# Patient Record
Sex: Male | Born: 1965 | Race: White | Hispanic: No | Smoking: Never smoker
Health system: Southern US, Community
[De-identification: ages and names within clinical notes are randomized; demographics above are authoritative.]

## PROBLEM LIST (undated history)

## (undated) DIAGNOSIS — H9319 Tinnitus, unspecified ear: Secondary | ICD-10-CM

## (undated) DIAGNOSIS — K219 Gastro-esophageal reflux disease without esophagitis: Secondary | ICD-10-CM

## (undated) HISTORY — PX: VASECTOMY: SHX75

## (undated) HISTORY — DX: Tinnitus, unspecified ear: H93.19

## (undated) HISTORY — PX: NECK SURGERY: SHX720

## (undated) HISTORY — DX: Gastro-esophageal reflux disease without esophagitis: K21.9

## (undated) HISTORY — PX: SPINE SURGERY: SHX786

---

## 1997-11-24 ENCOUNTER — Other Ambulatory Visit: Admission: RE | Admit: 1997-11-24 | Discharge: 1997-11-24 | Payer: Self-pay | Admitting: Dermatology

## 2005-06-25 ENCOUNTER — Ambulatory Visit: Payer: Self-pay | Admitting: Orthopaedic Surgery

## 2005-10-08 ENCOUNTER — Ambulatory Visit: Payer: Self-pay | Admitting: Neurosurgery

## 2008-02-11 ENCOUNTER — Encounter: Admission: RE | Admit: 2008-02-11 | Discharge: 2008-02-11 | Payer: Self-pay | Admitting: Sports Medicine

## 2008-02-23 ENCOUNTER — Ambulatory Visit: Payer: Self-pay | Admitting: Family Medicine

## 2009-03-22 ENCOUNTER — Ambulatory Visit: Payer: Self-pay | Admitting: Family Medicine

## 2009-05-11 ENCOUNTER — Ambulatory Visit: Payer: Self-pay

## 2009-05-13 ENCOUNTER — Ambulatory Visit: Payer: Self-pay

## 2009-07-18 ENCOUNTER — Encounter: Payer: Self-pay | Admitting: Orthopedic Surgery

## 2009-08-05 ENCOUNTER — Encounter: Payer: Self-pay | Admitting: Orthopedic Surgery

## 2009-09-04 ENCOUNTER — Encounter: Payer: Self-pay | Admitting: Orthopedic Surgery

## 2010-04-04 ENCOUNTER — Ambulatory Visit (HOSPITAL_COMMUNITY)
Admission: RE | Admit: 2010-04-04 | Discharge: 2010-04-04 | Payer: Self-pay | Source: Home / Self Care | Admitting: Urology

## 2010-07-19 LAB — CBC
MCH: 33.2 pg (ref 26.0–34.0)
MCHC: 35.3 g/dL (ref 30.0–36.0)
MCV: 94.2 fL (ref 78.0–100.0)
Platelets: 141 10*3/uL — ABNORMAL LOW (ref 150–400)

## 2010-07-19 LAB — PROTIME-INR: Prothrombin Time: 13.7 seconds (ref 11.6–15.2)

## 2010-10-12 ENCOUNTER — Other Ambulatory Visit: Payer: Self-pay | Admitting: Urology

## 2010-10-12 DIAGNOSIS — N281 Cyst of kidney, acquired: Secondary | ICD-10-CM

## 2010-10-27 ENCOUNTER — Other Ambulatory Visit: Payer: Self-pay | Admitting: Urology

## 2010-10-27 DIAGNOSIS — N281 Cyst of kidney, acquired: Secondary | ICD-10-CM

## 2010-11-02 ENCOUNTER — Other Ambulatory Visit (HOSPITAL_COMMUNITY): Payer: Self-pay

## 2010-11-20 ENCOUNTER — Other Ambulatory Visit: Payer: Self-pay | Admitting: Urology

## 2010-11-20 ENCOUNTER — Ambulatory Visit (HOSPITAL_COMMUNITY)
Admission: RE | Admit: 2010-11-20 | Discharge: 2010-11-20 | Disposition: A | Discharge status: Home / Self Care | Payer: BC Managed Care – PPO | Source: Ambulatory Visit | Attending: Urology | Admitting: Urology

## 2010-11-20 ENCOUNTER — Other Ambulatory Visit: Payer: Self-pay | Admitting: Interventional Radiology

## 2010-11-20 DIAGNOSIS — Q619 Cystic kidney disease, unspecified: Secondary | ICD-10-CM | POA: Insufficient documentation

## 2010-11-20 DIAGNOSIS — N281 Cyst of kidney, acquired: Secondary | ICD-10-CM

## 2010-11-20 LAB — BASIC METABOLIC PANEL
BUN: 10 mg/dL (ref 6–23)
Creatinine, Ser: 0.72 mg/dL (ref 0.50–1.35)
GFR calc Af Amer: >60 mL/min (ref >60)
GFR calc non Af Amer: >60 mL/min (ref >60)
Potassium: 3.9 mEq/L (ref 3.5–5.1)

## 2010-11-20 LAB — PROTIME-INR
INR: 1.04 (ref 0.00–1.49)
Prothrombin Time: 13.8 seconds (ref 11.6–15.2)

## 2010-11-20 LAB — CBC
Hemoglobin: 14.4 g/dL (ref 13.0–17.0)
MCHC: 34.6 g/dL (ref 30.0–36.0)
WBC: 4.2 10*3/uL (ref 4.0–10.5)

## 2010-11-21 ENCOUNTER — Other Ambulatory Visit: Payer: Self-pay

## 2011-01-05 ENCOUNTER — Ambulatory Visit: Payer: Self-pay | Admitting: Family Medicine

## 2011-01-29 ENCOUNTER — Telehealth: Payer: Self-pay | Admitting: Emergency Medicine

## 2011-01-30 ENCOUNTER — Telehealth (HOSPITAL_COMMUNITY): Payer: Self-pay | Admitting: Interventional Radiology

## 2011-01-30 NOTE — Progress Notes (Signed)
Called benign cyto results to pt. He had no questions.

## 2011-01-30 NOTE — Telephone Encounter (Signed)
Message copied by Eda Keys III on Tue Jan 30, 2011 10:57 AM ------      Message from: [place], [place]      Created: Mon Jan 29, 2011  1:44 PM      Regarding: PT WANTS RESULTS       PT CALLED ON Monday 01-29-11 AND WOULD LIKE YOU TO CALL HIM WITH HIS RENAL CYSTS ASPIRATION RESULTS AT [phone]!            THANKS,      TAMMY

## 2011-02-19 NOTE — Telephone Encounter (Signed)
SEE TELEPHONE NOTE

## 2011-03-12 DIAGNOSIS — R1013 Epigastric pain: Secondary | ICD-10-CM | POA: Insufficient documentation

## 2011-03-12 DIAGNOSIS — K769 Liver disease, unspecified: Secondary | ICD-10-CM | POA: Insufficient documentation

## 2012-01-17 ENCOUNTER — Ambulatory Visit: Payer: Self-pay | Admitting: Family Medicine

## 2012-12-19 ENCOUNTER — Ambulatory Visit: Payer: Self-pay | Admitting: Gastroenterology

## 2012-12-22 LAB — PATHOLOGY REPORT

## 2013-05-15 DIAGNOSIS — S2329XA Dislocation of other parts of thorax, initial encounter: Secondary | ICD-10-CM | POA: Insufficient documentation

## 2013-08-27 DIAGNOSIS — K219 Gastro-esophageal reflux disease without esophagitis: Secondary | ICD-10-CM | POA: Insufficient documentation

## 2013-12-31 ENCOUNTER — Other Ambulatory Visit: Payer: Self-pay | Admitting: Neurosurgery

## 2013-12-31 DIAGNOSIS — M5412 Radiculopathy, cervical region: Secondary | ICD-10-CM

## 2014-01-01 ENCOUNTER — Other Ambulatory Visit: Payer: BC Managed Care – PPO

## 2014-01-05 ENCOUNTER — Other Ambulatory Visit: Payer: BC Managed Care – PPO

## 2014-04-27 ENCOUNTER — Ambulatory Visit: Payer: Self-pay | Admitting: Orthopaedic Surgery

## 2014-09-01 ENCOUNTER — Ambulatory Visit
Admit: 2014-09-01 | Disposition: A | Discharge status: Home / Self Care | Payer: Self-pay | Attending: Gastroenterology | Admitting: Gastroenterology

## 2014-09-01 ENCOUNTER — Other Ambulatory Visit: Payer: Self-pay

## 2014-09-28 LAB — SURGICAL PATHOLOGY

## 2014-12-06 HISTORY — PX: SHOULDER SURGERY: SHX246

## 2014-12-06 HISTORY — PX: BICEPS TENODESIS: SHX895

## 2014-12-28 ENCOUNTER — Other Ambulatory Visit: Payer: Self-pay | Admitting: Otolaryngology

## 2014-12-28 DIAGNOSIS — R1310 Dysphagia, unspecified: Secondary | ICD-10-CM

## 2015-01-18 ENCOUNTER — Ambulatory Visit: Payer: Self-pay

## 2015-03-28 ENCOUNTER — Ambulatory Visit (INDEPENDENT_AMBULATORY_CARE_PROVIDER_SITE_OTHER): Payer: BLUE CROSS/BLUE SHIELD | Admitting: Family Medicine

## 2015-03-28 ENCOUNTER — Encounter: Payer: Self-pay | Admitting: Family Medicine

## 2015-03-28 VITALS — BP 130/64 | HR 90 | Temp 98.5°F | Resp 16 | Ht 69.0 in | Wt 150.4 lb

## 2015-03-28 DIAGNOSIS — Z0000 Encounter for general adult medical examination without abnormal findings: Secondary | ICD-10-CM

## 2015-03-28 NOTE — Progress Notes (Signed)
Name: Craig ClossDavid A Lamb   MRN: [id number]    DOB: 1966-02-19   Date:03/28/2015       Progress Note  Subjective  Chief Complaint  Chief Complaint  Patient presents with  . Annual Exam    HPI  Patient presents for annual H&P at age 49. Since last visit he has had dislocation of the costovertebral joint and has been noted to have a hepatic lesion abdominal pain followed by another specialist.  Past Medical History  Diagnosis Date  . GERD (gastroesophageal reflux disease)     Social History  Substance Use Topics  . Smoking status: Never Smoker   . Smokeless tobacco: Never Used  . Alcohol Use: No     Current outpatient prescriptions:  .  omeprazole (PRILOSEC) 40 MG capsule, 40 mg once daily. , Disp: , Rfl:   No Known Allergies  Review of Systems  Constitutional: Negative for fever, chills and weight loss.  HENT: Negative for congestion, hearing loss, sore throat and tinnitus.   Eyes: Negative for blurred vision, double vision and redness.  Respiratory: Negative for cough, hemoptysis and shortness of breath.   Cardiovascular: Negative for chest pain, palpitations, orthopnea, claudication and leg swelling.  Gastrointestinal: Positive for abdominal pain. Negative for heartburn, nausea, vomiting, diarrhea, constipation and blood in stool.  Genitourinary: Negative for dysuria, urgency, frequency and hematuria.  Musculoskeletal: Positive for back pain and joint pain. Negative for myalgias, falls and neck pain.  Skin: Negative for itching.  Neurological: Negative for dizziness, tingling, tremors, focal weakness, seizures, loss of consciousness, weakness and headaches.  Endo/Heme/Allergies: Does not bruise/bleed easily.  Psychiatric/Behavioral: Negative for depression and substance abuse. The patient is not nervous/anxious and does not have insomnia.      Objective  Filed Vitals:   03/28/15 0936  BP: 130/64  Pulse: 90  Temp: 98.5 F (36.9 C)  TempSrc: Oral  Resp: 16  Height:  5\' 9"  (1.753 m)  Weight: 150 lb 6.4 oz (68.221 kg)  SpO2: 98%     Physical Exam  Constitutional: He is oriented to person, place, and time and well-developed, well-nourished, and in no distress.  HENT:  Head: Normocephalic.  Eyes: EOM are normal. Pupils are equal, round, and reactive to light.  Neck: Normal range of motion. Neck supple. No thyromegaly present.  Cardiovascular: Normal rate, regular rhythm and normal heart sounds.   No murmur heard. Pulmonary/Chest: Effort normal and breath sounds normal. No respiratory distress. He has no wheezes.  Abdominal: Soft. Bowel sounds are normal.  Genitourinary: Rectum normal, prostate normal and penis normal. Guaiac negative stool. No discharge found.  Musculoskeletal: Normal range of motion. He exhibits no edema.  Lymphadenopathy:    He has no cervical adenopathy.  Neurological: He is alert and oriented to person, place, and time. No cranial nerve deficit. Gait normal. Coordination normal.  Skin: Skin is warm and dry. No rash noted.  Psychiatric: Affect and judgment normal.      Assessment & Plan  1. Annual physical exam  - CBC with Differential/Platelet - POC Hemoccult Bld/Stl (1-Cd Office Dx) - PSA - Comprehensive metabolic panel - Lipid panel - TSH - POC Hemoccult Bld/Stl (1-Cd Office Dx)

## 2015-03-29 LAB — CBC WITH DIFFERENTIAL/PLATELET
BASOS ABS: 0 10*3/uL (ref 0.0–0.2)
Basos: 0 %
EOS (ABSOLUTE): 0.1 10*3/uL (ref 0.0–0.4)
Eos: 1 %
HEMOGLOBIN: 15.5 g/dL (ref 12.6–17.7)
Hematocrit: 44.9 % (ref 37.5–51.0)
IMMATURE GRANS (ABS): 0 10*3/uL (ref 0.0–0.1)
Immature Granulocytes: 0 %
LYMPHS: 36 %
Lymphocytes Absolute: 1.7 10*3/uL (ref 0.7–3.1)
MCH: 32.2 pg (ref 26.6–33.0)
MCHC: 34.5 g/dL (ref 31.5–35.7)
MCV: 93 fL (ref 79–97)
MONOCYTES: 11 %
Monocytes Absolute: 0.5 10*3/uL (ref 0.1–0.9)
Neutrophils Absolute: 2.5 10*3/uL (ref 1.4–7.0)
Neutrophils: 52 %
PLATELETS: 163 10*3/uL (ref 150–379)
RBC: 4.82 x10E6/uL (ref 4.14–5.80)
RDW: 12.6 % (ref 12.3–15.4)
WBC: 4.8 10*3/uL (ref 3.4–10.8)

## 2015-03-30 LAB — PSA: Prostate Specific Ag, Serum: 0.6 ng/mL (ref 0.0–4.0)

## 2015-03-30 LAB — COMPREHENSIVE METABOLIC PANEL
A/G RATIO: 2 (ref 1.1–2.5)
ALBUMIN: 4.4 g/dL (ref 3.5–5.5)
ALT: 26 IU/L (ref 0–44)
AST: 27 IU/L (ref 0–40)
Alkaline Phosphatase: 55 IU/L (ref 39–117)
BILIRUBIN TOTAL: 0.5 mg/dL (ref 0.0–1.2)
BUN / CREAT RATIO: 11 (ref 9–20)
BUN: 10 mg/dL (ref 6–24)
CHLORIDE: 100 mmol/L (ref 97–106)
CO2: 25 mmol/L (ref 18–29)
Calcium: 9.4 mg/dL (ref 8.7–10.2)
Creatinine, Ser: 0.89 mg/dL (ref 0.76–1.27)
GFR, EST AFRICAN AMERICAN: 114 mL/min/{1.73_m2} (ref >59)
GFR, EST NON AFRICAN AMERICAN: 99 mL/min/{1.73_m2} (ref >59)
GLUCOSE: 81 mg/dL (ref 65–99)
Globulin, Total: 2.2 g/dL (ref 1.5–4.5)
Potassium: 4.1 mmol/L (ref 3.5–5.2)
Sodium: 141 mmol/L (ref 136–144)
TOTAL PROTEIN: 6.6 g/dL (ref 6.0–8.5)

## 2015-03-30 LAB — LIPID PANEL
CHOL/HDL RATIO: 2.8 ratio (ref 0.0–5.0)
Cholesterol, Total: 188 mg/dL (ref 100–199)
HDL: 68 mg/dL (ref >39)
LDL Calculated: 110 mg/dL — ABNORMAL HIGH (ref 0–99)
Triglycerides: 48 mg/dL (ref 0–149)
VLDL CHOLESTEROL CAL: 10 mg/dL (ref 5–40)

## 2015-03-30 LAB — TSH: TSH: 1.22 u[IU]/mL (ref 0.450–4.500)

## 2015-04-11 ENCOUNTER — Encounter: Payer: Self-pay | Admitting: Family Medicine

## 2015-08-24 ENCOUNTER — Ambulatory Visit: Payer: BLUE CROSS/BLUE SHIELD | Admitting: Family Medicine

## 2015-10-31 ENCOUNTER — Other Ambulatory Visit: Payer: Self-pay | Admitting: Gastroenterology

## 2015-10-31 LAB — HM COLONOSCOPY

## 2016-03-28 ENCOUNTER — Emergency Department (HOSPITAL_COMMUNITY)
Admission: EM | Admit: 2016-03-28 | Discharge: 2016-03-28 | Disposition: A | Discharge status: Home / Self Care | Payer: BLUE CROSS/BLUE SHIELD | Attending: Emergency Medicine | Admitting: Emergency Medicine

## 2016-03-28 ENCOUNTER — Encounter (HOSPITAL_COMMUNITY): Payer: Self-pay | Admitting: *Deleted

## 2016-03-28 DIAGNOSIS — R202 Paresthesia of skin: Secondary | ICD-10-CM | POA: Diagnosis not present

## 2016-03-28 DIAGNOSIS — M5412 Radiculopathy, cervical region: Secondary | ICD-10-CM | POA: Diagnosis not present

## 2016-03-28 DIAGNOSIS — M542 Cervicalgia: Secondary | ICD-10-CM | POA: Diagnosis present

## 2016-03-28 MED ORDER — PREDNISONE 20 MG PO TABS
60.0000 mg | ORAL_TABLET | Freq: Once | ORAL | Status: AC
  Administered 2016-03-28: 60 mg via ORAL
  Filled 2016-03-28: qty 3

## 2016-03-28 MED ORDER — PREDNISONE 10 MG PO TABS: 20.0000 mg | ORAL_TABLET | Freq: Every day | ORAL | 0 refills | Status: AC

## 2016-03-28 MED ORDER — KETOROLAC TROMETHAMINE 30 MG/ML IJ SOLN
30.0000 mg | Freq: Once | INTRAMUSCULAR | Status: AC
  Administered 2016-03-28: 30 mg via INTRAMUSCULAR
  Filled 2016-03-28: qty 1

## 2016-03-28 NOTE — ED Triage Notes (Signed)
Pt reports hx of neck pain and cervical fusion. Pt normally has mild pain and bilateral little finger numbness but reports increase in pain and numbness since Saturday. Denies any trauma. Ambulatory at triage.

## 2016-03-28 NOTE — ED Provider Notes (Signed)
[facility]-EMERGENCY DEPT Provider Note   CSN: [id number] Arrival date & time: 03/28/16  1720     History   Chief Complaint Chief Complaint  Patient presents with  . Neck Pain  . Numbness    HPI Craig Bowen is a 50 y.o. male.  The history is provided by the patient.  Illness  This is a chronic problem. The current episode started 2 days ago. The problem occurs constantly. The problem has not changed since onset.Pertinent negatives include no chest pain, no abdominal pain, no headaches and no shortness of breath. Associated symptoms comments: Neck pain and hand tingling. Nothing aggravates the symptoms. Nothing relieves the symptoms. He has tried nothing for the symptoms. The treatment provided no relief.    Past Medical History:  Diagnosis Date  . GERD (gastroesophageal reflux disease)     Patient Active Problem List   Diagnosis Date Noted  . Acid reflux 08/27/2013  . 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 Abdominal pain, epigastric 03/12/2011  . Hepatic lesion 03/12/2011    Past Surgical History:  Procedure Laterality Date  . NECK SURGERY    . SHOULDER SURGERY Left august 2016       Home Medications    Prior to Admission medications   Medication Sig Start Date End Date Taking? Authorizing Provider  Multiple Vitamin (MULTIVITAMIN WITH MINERALS) TABS tablet Take 1 tablet by mouth daily.   Yes Historical Provider, MD  predniSONE (DELTASONE) 10 MG tablet Take 2 tablets (20 mg total) by mouth daily. 03/28/16 03/31/16  Alvira MondayErin Schlossman, MD    Family History History reviewed. No pertinent family history.  Social History Social History  Substance Use Topics  . Smoking status: Never Smoker  . Smokeless tobacco: Never Used  . Alcohol use No     Allergies   Patient has no known allergies.   Review of Systems Review of Systems  Constitutional: Negative for chills and fever.  HENT: Negative for ear pain and sore throat.   Eyes: Negative for pain and  visual disturbance.  Respiratory: Negative for cough and shortness of breath.   Cardiovascular: Negative for chest pain and palpitations.  Gastrointestinal: Negative for abdominal pain and vomiting.  Genitourinary: Negative for dysuria and hematuria.  Musculoskeletal: Positive for neck pain. Negative for arthralgias and back pain.  Skin: Negative for color change and rash.  Neurological: Negative for seizures, syncope and headaches.  All other systems reviewed and are negative.    Physical Exam Updated Vital Signs BP 108/74   Pulse 69   Temp 97.9 F (36.6 C) (Oral)   Resp 20   SpO2 99%   Physical Exam  Constitutional: He is oriented to person, place, and time. He appears well-developed and well-nourished.  HENT:  Head: Normocephalic and atraumatic.  Eyes: Conjunctivae are normal.  Neck: Neck supple.  Cardiovascular: Normal rate and regular rhythm.   No murmur heard. Pulmonary/Chest: Effort normal and breath sounds normal. No respiratory distress.  Abdominal: Soft. There is no tenderness.  Musculoskeletal: He exhibits no edema.  Neurological: He is alert and oriented to person, place, and time. No cranial nerve deficit or sensory deficit. He exhibits normal muscle tone. Coordination normal.  Reflex Scores:      Tricep reflexes are 1+ on the right side and 1+ on the left side.      Brachioradialis reflexes are 1+ on the right side and 1+ on the left side.      Patellar reflexes are 1+ on the right side and  1+ on the left side. Normal ambulation  Skin: Skin is warm and dry.  Psychiatric: He has a normal mood and affect.  Nursing note and vitals reviewed.    ED Treatments / Results  Labs (all labs ordered are listed, but only abnormal results are displayed) Labs Reviewed - No data to display  EKG  EKG Interpretation None       Radiology No results found.  Procedures Procedures (including critical care time)  Medications Ordered in ED Medications  ketorolac  (TORADOL) 30 MG/ML injection 30 mg (30 mg Intramuscular Given 03/28/16 2119)  predniSONE (DELTASONE) tablet 60 mg (60 mg Oral Given 03/28/16 2120)     Initial Impression / Assessment and Plan / ED Course  I have reviewed the triage vital signs and the nursing notes.  Pertinent labs & imaging results that were available during my care of the patient were reviewed by me and considered in my medical decision making (see chart for details).  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 He chronically has lower cervical neck pain with radiculopathy to bilateral hands, numbness and tingling in his pinkies. Over the last 3 days the pain has gotten worse and the tingling is red and warm his hand. He has no weakness no other symptoms. Vital signs are stable. He is afebrile. No history of steroid use, no history of IV drug abuse, no history of trauma, no history of no history of cancer. Patient does not have any signs of weakness on neurologic exam. He still does have sensation. Full range of motion of the neck with some minimal tenderness to palpation. Says the pain is similar to chronic he does not take any pain medications at home he is offered NSAIDs. He accepts. He's also given steroids told to follow-up with the back surgeon. He needs further evaluation on outpatient basis. Possible MRI outpatient. At this time no other further workup as needed pain is safe for discharge home. Vital signs stable time discharge. Strict return precautions are given.  Final Clinical Impressions(s) / ED Diagnoses   Final diagnoses:  Cervical radiculopathy  Paresthesia    New Prescriptions Discharge Medication List as of 03/28/2016 11:12 PM    START taking these medications   Details  predniSONE (DELTASONE) 10 MG tablet Take 2 tablets (20 mg total) by mouth daily., Starting Wed 03/28/2016, Until Sat  03/31/2016, Print         Cherlynn PerchesEric Rune Mendez, MD 03/29/16 [id number]    Alvira MondayErin Schlossman, MD 03/30/16 1442

## 2016-04-02 ENCOUNTER — Other Ambulatory Visit: Payer: Self-pay | Admitting: Family Medicine

## 2016-04-02 DIAGNOSIS — M5412 Radiculopathy, cervical region: Secondary | ICD-10-CM

## 2016-04-04 ENCOUNTER — Ambulatory Visit
Admission: RE | Admit: 2016-04-04 | Discharge: 2016-04-04 | Disposition: A | Discharge status: Home / Self Care | Payer: BLUE CROSS/BLUE SHIELD | Source: Ambulatory Visit | Attending: Family Medicine | Admitting: Family Medicine

## 2016-04-04 DIAGNOSIS — M4802 Spinal stenosis, cervical region: Secondary | ICD-10-CM | POA: Insufficient documentation

## 2016-04-04 DIAGNOSIS — Z9889 Other specified postprocedural states: Secondary | ICD-10-CM | POA: Diagnosis not present

## 2016-04-04 DIAGNOSIS — M5412 Radiculopathy, cervical region: Secondary | ICD-10-CM | POA: Insufficient documentation

## 2016-04-04 DIAGNOSIS — M5021 Other cervical disc displacement,  high cervical region: Secondary | ICD-10-CM | POA: Diagnosis not present

## 2016-07-04 LAB — HEMOGLOBIN A1C: Hemoglobin A1C: 4.9

## 2016-08-12 DIAGNOSIS — B303 Acute epidemic hemorrhagic conjunctivitis (enteroviral): Secondary | ICD-10-CM | POA: Diagnosis not present

## 2016-08-14 DIAGNOSIS — H43393 Other vitreous opacities, bilateral: Secondary | ICD-10-CM | POA: Diagnosis not present

## 2016-08-14 DIAGNOSIS — H2513 Age-related nuclear cataract, bilateral: Secondary | ICD-10-CM | POA: Diagnosis not present

## 2016-08-14 DIAGNOSIS — H1131 Conjunctival hemorrhage, right eye: Secondary | ICD-10-CM | POA: Diagnosis not present

## 2016-08-21 DIAGNOSIS — M7522 Bicipital tendinitis, left shoulder: Secondary | ICD-10-CM | POA: Diagnosis not present

## 2016-09-06 DIAGNOSIS — M7522 Bicipital tendinitis, left shoulder: Secondary | ICD-10-CM | POA: Diagnosis not present

## 2016-09-06 DIAGNOSIS — M62512 Muscle wasting and atrophy, not elsewhere classified, left shoulder: Secondary | ICD-10-CM | POA: Diagnosis not present

## 2016-09-06 DIAGNOSIS — K219 Gastro-esophageal reflux disease without esophagitis: Secondary | ICD-10-CM | POA: Diagnosis not present

## 2016-09-06 DIAGNOSIS — M25512 Pain in left shoulder: Secondary | ICD-10-CM | POA: Diagnosis not present

## 2016-09-06 DIAGNOSIS — T8484XA Pain due to internal orthopedic prosthetic devices, implants and grafts, initial encounter: Secondary | ICD-10-CM | POA: Diagnosis not present

## 2016-09-11 DIAGNOSIS — M7522 Bicipital tendinitis, left shoulder: Secondary | ICD-10-CM | POA: Diagnosis not present

## 2016-09-14 DIAGNOSIS — M7522 Bicipital tendinitis, left shoulder: Secondary | ICD-10-CM | POA: Diagnosis not present

## 2016-09-18 DIAGNOSIS — M7522 Bicipital tendinitis, left shoulder: Secondary | ICD-10-CM | POA: Diagnosis not present

## 2016-09-25 DIAGNOSIS — M7522 Bicipital tendinitis, left shoulder: Secondary | ICD-10-CM | POA: Diagnosis not present

## 2016-09-30 DIAGNOSIS — M25512 Pain in left shoulder: Secondary | ICD-10-CM | POA: Diagnosis not present

## 2016-09-30 DIAGNOSIS — M62512 Muscle wasting and atrophy, not elsewhere classified, left shoulder: Secondary | ICD-10-CM | POA: Diagnosis not present

## 2016-10-04 DIAGNOSIS — M7522 Bicipital tendinitis, left shoulder: Secondary | ICD-10-CM | POA: Diagnosis not present

## 2016-10-05 DIAGNOSIS — L738 Other specified follicular disorders: Secondary | ICD-10-CM | POA: Diagnosis not present

## 2016-10-07 DIAGNOSIS — M62512 Muscle wasting and atrophy, not elsewhere classified, left shoulder: Secondary | ICD-10-CM | POA: Diagnosis not present

## 2016-10-07 DIAGNOSIS — M25512 Pain in left shoulder: Secondary | ICD-10-CM | POA: Diagnosis not present

## 2016-10-12 DIAGNOSIS — M7522 Bicipital tendinitis, left shoulder: Secondary | ICD-10-CM | POA: Diagnosis not present

## 2016-10-16 DIAGNOSIS — M7522 Bicipital tendinitis, left shoulder: Secondary | ICD-10-CM | POA: Diagnosis not present

## 2016-10-18 DIAGNOSIS — M7522 Bicipital tendinitis, left shoulder: Secondary | ICD-10-CM | POA: Diagnosis not present

## 2016-10-23 DIAGNOSIS — M7522 Bicipital tendinitis, left shoulder: Secondary | ICD-10-CM | POA: Diagnosis not present

## 2016-10-25 DIAGNOSIS — M7522 Bicipital tendinitis, left shoulder: Secondary | ICD-10-CM | POA: Diagnosis not present

## 2016-10-29 DIAGNOSIS — M7522 Bicipital tendinitis, left shoulder: Secondary | ICD-10-CM | POA: Diagnosis not present

## 2016-10-31 DIAGNOSIS — M25512 Pain in left shoulder: Secondary | ICD-10-CM | POA: Diagnosis not present

## 2016-10-31 DIAGNOSIS — M62512 Muscle wasting and atrophy, not elsewhere classified, left shoulder: Secondary | ICD-10-CM | POA: Diagnosis not present

## 2016-11-01 DIAGNOSIS — M7522 Bicipital tendinitis, left shoulder: Secondary | ICD-10-CM | POA: Diagnosis not present

## 2016-11-05 DIAGNOSIS — M7522 Bicipital tendinitis, left shoulder: Secondary | ICD-10-CM | POA: Diagnosis not present

## 2016-11-06 DIAGNOSIS — M7522 Bicipital tendinitis, left shoulder: Secondary | ICD-10-CM | POA: Diagnosis not present

## 2016-11-08 DIAGNOSIS — M7522 Bicipital tendinitis, left shoulder: Secondary | ICD-10-CM | POA: Diagnosis not present

## 2016-11-15 DIAGNOSIS — M7522 Bicipital tendinitis, left shoulder: Secondary | ICD-10-CM | POA: Diagnosis not present

## 2016-11-20 DIAGNOSIS — M7522 Bicipital tendinitis, left shoulder: Secondary | ICD-10-CM | POA: Diagnosis not present

## 2016-11-27 DIAGNOSIS — M7522 Bicipital tendinitis, left shoulder: Secondary | ICD-10-CM | POA: Diagnosis not present

## 2016-11-30 DIAGNOSIS — M62512 Muscle wasting and atrophy, not elsewhere classified, left shoulder: Secondary | ICD-10-CM | POA: Diagnosis not present

## 2016-11-30 DIAGNOSIS — M25512 Pain in left shoulder: Secondary | ICD-10-CM | POA: Diagnosis not present

## 2016-12-04 DIAGNOSIS — M7522 Bicipital tendinitis, left shoulder: Secondary | ICD-10-CM | POA: Diagnosis not present

## 2016-12-12 DIAGNOSIS — S4432XA Injury of axillary nerve, left arm, initial encounter: Secondary | ICD-10-CM | POA: Diagnosis not present

## 2016-12-31 DIAGNOSIS — M62512 Muscle wasting and atrophy, not elsewhere classified, left shoulder: Secondary | ICD-10-CM | POA: Diagnosis not present

## 2016-12-31 DIAGNOSIS — M25512 Pain in left shoulder: Secondary | ICD-10-CM | POA: Diagnosis not present

## 2017-01-16 DIAGNOSIS — R29898 Other symptoms and signs involving the musculoskeletal system: Secondary | ICD-10-CM | POA: Diagnosis not present

## 2017-01-16 DIAGNOSIS — M542 Cervicalgia: Secondary | ICD-10-CM | POA: Diagnosis not present

## 2017-01-16 DIAGNOSIS — G8929 Other chronic pain: Secondary | ICD-10-CM | POA: Diagnosis not present

## 2017-01-16 DIAGNOSIS — S4432XA Injury of axillary nerve, left arm, initial encounter: Secondary | ICD-10-CM | POA: Diagnosis not present

## 2017-01-23 DIAGNOSIS — S4432XD Injury of axillary nerve, left arm, subsequent encounter: Secondary | ICD-10-CM | POA: Diagnosis not present

## 2017-02-07 DIAGNOSIS — S4432XD Injury of axillary nerve, left arm, subsequent encounter: Secondary | ICD-10-CM | POA: Diagnosis not present

## 2017-02-07 DIAGNOSIS — Z9889 Other specified postprocedural states: Secondary | ICD-10-CM | POA: Diagnosis not present

## 2017-02-07 DIAGNOSIS — R29898 Other symptoms and signs involving the musculoskeletal system: Secondary | ICD-10-CM | POA: Diagnosis not present

## 2017-02-07 DIAGNOSIS — Z981 Arthrodesis status: Secondary | ICD-10-CM | POA: Diagnosis not present

## 2017-03-04 DIAGNOSIS — M8568 Other cyst of bone, other site: Secondary | ICD-10-CM | POA: Diagnosis not present

## 2017-03-04 DIAGNOSIS — X58XXXA Exposure to other specified factors, initial encounter: Secondary | ICD-10-CM | POA: Diagnosis not present

## 2017-03-04 DIAGNOSIS — S46002A Unspecified injury of muscle(s) and tendon(s) of the rotator cuff of left shoulder, initial encounter: Secondary | ICD-10-CM | POA: Diagnosis not present

## 2017-03-04 DIAGNOSIS — S46812A Strain of other muscles, fascia and tendons at shoulder and upper arm level, left arm, initial encounter: Secondary | ICD-10-CM | POA: Diagnosis not present

## 2017-03-08 DIAGNOSIS — R29898 Other symptoms and signs involving the musculoskeletal system: Secondary | ICD-10-CM | POA: Diagnosis not present

## 2017-03-20 DIAGNOSIS — M25551 Pain in right hip: Secondary | ICD-10-CM | POA: Diagnosis not present

## 2017-04-19 DIAGNOSIS — R29898 Other symptoms and signs involving the musculoskeletal system: Secondary | ICD-10-CM | POA: Diagnosis not present

## 2017-04-19 DIAGNOSIS — M25512 Pain in left shoulder: Secondary | ICD-10-CM | POA: Diagnosis not present

## 2017-04-19 DIAGNOSIS — G8929 Other chronic pain: Secondary | ICD-10-CM | POA: Diagnosis not present

## 2017-04-25 DIAGNOSIS — G8929 Other chronic pain: Secondary | ICD-10-CM | POA: Diagnosis not present

## 2017-04-25 DIAGNOSIS — M25512 Pain in left shoulder: Secondary | ICD-10-CM | POA: Diagnosis not present

## 2017-04-25 DIAGNOSIS — R29898 Other symptoms and signs involving the musculoskeletal system: Secondary | ICD-10-CM | POA: Diagnosis not present

## 2017-05-22 DIAGNOSIS — M7522 Bicipital tendinitis, left shoulder: Secondary | ICD-10-CM | POA: Diagnosis not present

## 2017-05-22 DIAGNOSIS — S4432XD Injury of axillary nerve, left arm, subsequent encounter: Secondary | ICD-10-CM | POA: Diagnosis not present

## 2017-06-07 DIAGNOSIS — M25512 Pain in left shoulder: Secondary | ICD-10-CM | POA: Diagnosis not present

## 2017-06-11 DIAGNOSIS — R6889 Other general symptoms and signs: Secondary | ICD-10-CM | POA: Diagnosis not present

## 2017-06-11 DIAGNOSIS — J069 Acute upper respiratory infection, unspecified: Secondary | ICD-10-CM | POA: Diagnosis not present

## 2017-06-16 ENCOUNTER — Emergency Department
Admission: EM | Admit: 2017-06-16 | Discharge: 2017-06-16 | Disposition: A | Discharge status: Home / Self Care | Payer: BLUE CROSS/BLUE SHIELD | Attending: Emergency Medicine | Admitting: Emergency Medicine

## 2017-06-16 ENCOUNTER — Other Ambulatory Visit: Payer: Self-pay

## 2017-06-16 ENCOUNTER — Emergency Department: Payer: BLUE CROSS/BLUE SHIELD

## 2017-06-16 DIAGNOSIS — J181 Lobar pneumonia, unspecified organism: Secondary | ICD-10-CM | POA: Diagnosis not present

## 2017-06-16 DIAGNOSIS — J189 Pneumonia, unspecified organism: Secondary | ICD-10-CM

## 2017-06-16 DIAGNOSIS — R079 Chest pain, unspecified: Secondary | ICD-10-CM | POA: Diagnosis not present

## 2017-06-16 DIAGNOSIS — R05 Cough: Secondary | ICD-10-CM | POA: Diagnosis not present

## 2017-06-16 LAB — CBC
HEMATOCRIT: 43.8 % (ref 40.0–52.0)
Hemoglobin: 15.3 g/dL (ref 13.0–18.0)
MCH: 32.3 pg (ref 26.0–34.0)
MCHC: 34.8 g/dL (ref 32.0–36.0)
MCV: 92.6 fL (ref 80.0–100.0)
PLATELETS: 168 10*3/uL (ref 150–440)
RBC: 4.73 MIL/uL (ref 4.40–5.90)
RDW: 11.8 % (ref 11.5–14.5)
WBC: 8.8 10*3/uL (ref 3.8–10.6)

## 2017-06-16 LAB — BASIC METABOLIC PANEL
Anion gap: 11 (ref 5–15)
BUN: 16 mg/dL (ref 6–20)
CO2: 28 mmol/L (ref 22–32)
Calcium: 9.4 mg/dL (ref 8.9–10.3)
Chloride: 98 mmol/L — ABNORMAL LOW (ref 101–111)
Creatinine, Ser: 0.72 mg/dL (ref 0.61–1.24)
GFR calc Af Amer: >60 mL/min (ref >60)
GLUCOSE: 105 mg/dL — AB (ref 65–99)
POTASSIUM: 4.1 mmol/L (ref 3.5–5.1)
SODIUM: 137 mmol/L (ref 135–145)

## 2017-06-16 LAB — TROPONIN I: Troponin I: <0.03 ng/mL (ref <0.03)

## 2017-06-16 MED ORDER — HYDROCODONE-ACETAMINOPHEN 5-325 MG PO TABS: 1.0000 | ORAL_TABLET | Freq: Four times a day (QID) | ORAL | 0 refills | Status: DC | PRN

## 2017-06-16 MED ORDER — AZITHROMYCIN 500 MG PO TABS
ORAL_TABLET | ORAL | Status: AC
  Administered 2017-06-16: 500 mg via ORAL
  Filled 2017-06-16: qty 1

## 2017-06-16 MED ORDER — ACETAMINOPHEN 500 MG PO TABS
1000.0000 mg | ORAL_TABLET | Freq: Once | ORAL | Status: DC
  Filled 2017-06-16: qty 2

## 2017-06-16 MED ORDER — AZITHROMYCIN 500 MG PO TABS
500.0000 mg | ORAL_TABLET | Freq: Once | ORAL | Status: AC
  Administered 2017-06-16: 500 mg via ORAL

## 2017-06-16 MED ORDER — HYDROCODONE-ACETAMINOPHEN 5-325 MG PO TABS
1.0000 | ORAL_TABLET | Freq: Once | ORAL | Status: DC
Start: 2017-06-16 — End: 2017-06-16

## 2017-06-16 MED ORDER — AZITHROMYCIN 250 MG PO TABS: ORAL_TABLET | ORAL | 0 refills | Status: DC

## 2017-06-16 MED ORDER — HYDROCODONE-ACETAMINOPHEN 5-325 MG PO TABS
ORAL_TABLET | ORAL | Status: AC
  Filled 2017-06-16: qty 1

## 2017-06-16 NOTE — Discharge Instructions (Signed)

## 2017-06-16 NOTE — ED Provider Notes (Signed)
[facility] Emergency Department Provider Note  ____________________________________________  Time seen: Approximately 6:58 PM  I have reviewed the triage vital signs and the nursing notes.   HISTORY  Chief Complaint Chest Pain and Cough   HPI Craig Bowen is a 52 y.o. male with a history of GERD who presents for evaluation of chest pain and cough. Patient reports a week of cough productive of yellow phlegm initially and now dark green. Initially patient had a sore throat and bilateral ear aches. He went to see his primary care doctor and was diagnosed with a viral URI. This evening while watching TV patient developed sudden onset of sharp right-sided pleuritic chest pain which is mild if patient doesn't take a deep breath but becomes severe with deep breathing. Pain is non radiating. Patient denies fever, SOB, vomiting, diarrhea. Has no history of smoking or asthma. No history of heart disease. No personal or family history of blood clots, no recent travel or immobilization, leg pain or swelling, hemoptysis, exogenous hormones, or history of cancer.  Past Medical History:  Diagnosis Date  . GERD (gastroesophageal reflux disease)     Patient Active Problem List   Diagnosis Date Noted  . Acid reflux 08/27/2013  . 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 Abdominal pain, epigastric 03/12/2011  . Hepatic lesion 03/12/2011    Past Surgical History:  Procedure Laterality Date  . NECK SURGERY    . SHOULDER SURGERY Left august 2016    Prior to Admission medications   Medication Sig Start Date End Date Taking? Authorizing Provider  azithromycin (ZITHROMAX) 250 MG tablet Take 1 a day for 4 days 06/16/17   Don Perking, [place], MD  HYDROcodone-acetaminophen (NORCO/VICODIN) 5-325 MG tablet Take 1 tablet by mouth every 6 (six) hours as needed for moderate pain. 06/16/17   Nita Sickle, MD  Multiple Vitamin (MULTIVITAMIN WITH MINERALS) TABS tablet Take  1 tablet by mouth daily.    [provider]    Allergies Nsaids  No family history on file.  Social History Social History   Tobacco Use  . Smoking status: Never Smoker  . Smokeless tobacco: Never Used  Substance Use Topics  . Alcohol use: No    Alcohol/week: 0.0 oz  . Drug use: No    Review of Systems  Constitutional: Negative for fever. Eyes: Negative for visual changes. ENT: Negative for sore throat. Neck: No neck pain  Cardiovascular: + chest pain. Respiratory: Negative for shortness of breath. Gastrointestinal: Negative for abdominal pain, vomiting or diarrhea. Genitourinary: Negative for dysuria. Musculoskeletal: Negative for back pain. Skin: Negative for rash. Neurological: Negative for headaches, weakness or numbness. Psych: No SI or HI  ____________________________________________   PHYSICAL EXAM:  VITAL SIGNS: ED Triage Vitals  Enc Vitals Group     BP 06/16/17 1810 134/72     Pulse Rate 06/16/17 1810 83     Resp 06/16/17 1810 16     Temp 06/16/17 1810 99.3 F (37.4 C)     Temp Source 06/16/17 1810 Oral     SpO2 06/16/17 1810 100 %     Weight 06/16/17 1801 152 lb (68.9 kg)     Height 06/16/17 1801 5\' 8"  (1.727 m)     Head Circumference --      Peak Flow --      Pain Score 06/16/17 1801 10     Pain Loc --      Pain Edu? --      Excl. in GC? --  Constitutional: Alert and oriented. Well appearing and in no apparent distress. HEENT:      Head: Normocephalic and atraumatic.         Eyes: Conjunctivae are normal. Sclera is non-icteric.       Mouth/Throat: Mucous membranes are moist.       Neck: Supple with no signs of meningismus. Cardiovascular: Regular rate and rhythm. No murmurs, gallops, or rubs. 2+ symmetrical distal pulses are present in all extremities. No JVD. Respiratory: Normal respiratory effort. Lungs are clear to auscultation bilaterally. No wheezes, crackles, or rhonchi.  Gastrointestinal: Soft, non tender, and non  distended with positive bowel sounds. No rebound or guarding. Musculoskeletal: Nontender with normal range of motion in all extremities. No edema, cyanosis, or erythema of extremities. Neurologic: Normal speech and language. Face is symmetric. Moving all extremities. No gross focal neurologic deficits are appreciated. Skin: Skin is warm, dry and intact. No rash noted. Psychiatric: Mood and affect are normal. Speech and behavior are normal.  ____________________________________________   LABS (all labs ordered are listed, but only abnormal results are displayed)  Labs Reviewed  BASIC METABOLIC PANEL - Abnormal; Notable for the following components:      Result Value   Chloride 98 (*)    Glucose, Bld 105 (*)    All other components within normal limits  CBC  TROPONIN I   ____________________________________________  EKG  none  ____________________________________________  RADIOLOGY  Interpreted by me: CXR: R sided PNA   Interpretation by Radiologist:  Dg Chest 2 View  Result Date: 06/16/2017 CLINICAL DATA:  Productive cough.  Right-sided chest pain. EXAM: CHEST  2 VIEW COMPARISON:  None. FINDINGS: Heart size is normal. A right middle lobe pneumonia is present. The left lung is clear. There is no edema or effusion. Degenerative changes are present in the thoracic spine. Vertebral body heights and alignment are maintained. IMPRESSION: 1. Right middle lobe pneumonia. Electronically Signed   By: Marin Roberts M.D.   On: 06/16/2017 18:39     ____________________________________________   PROCEDURES  Procedure(s) performed: None Procedures Critical Care performed:  None ____________________________________________   INITIAL IMPRESSION / ASSESSMENT AND PLAN / ED COURSE   52 y.o. male with a history of GERD who presents for evaluation of productive cough times one week and new-onset of right sided pleuritic chest pain this evening. Patient is extremely well appearing,  no distress, low grade temp 99.69F but otherwise normal VS, lungs are clear to auscultation, normal work of breathing, normal sats. Chest x-ray concerning for right middle lobe pneumonia. Labs including white count is within normal limits. Patient meets criteria for outpatient management. He was started on a Z-Pak. His short course of Vicodin was prescribed for the pleuritic chest pain. Discussed return precautions and close follow-up with primary care doctor.      As part of my medical decision making, I reviewed the following data within the electronic MEDICAL RECORD NUMBER Nursing notes reviewed and incorporated, Labs reviewed , Radiograph reviewed , Notes from prior ED visits and [place] Controlled Substance Database    Pertinent labs & imaging results that were available during my care of the patient were reviewed by me and considered in my medical decision making (see chart for details).    ____________________________________________   FINAL CLINICAL IMPRESSION(S) / ED DIAGNOSES  Final diagnoses:  Community acquired pneumonia of right middle lobe of lung (HCC)      NEW MEDICATIONS STARTED DURING THIS VISIT:  ED Discharge Orders  Ordered    HYDROcodone-acetaminophen (NORCO/VICODIN) 5-325 MG tablet  Every 6 hours PRN     06/16/17 1858    azithromycin (ZITHROMAX) 250 MG tablet     06/16/17 1858       Note:  This document was prepared using Dragon voice recognition software and may include unintentional dictation errors.    Nita SickleVeronese, [place], MD 06/16/17 [phone]

## 2017-06-16 NOTE — ED Notes (Signed)
PT REFUSED EKG. Pt reported he felt as though this pain is upper resp. And ribs in nature and did not want an EKG performed.

## 2017-06-16 NOTE — ED Notes (Signed)
FIRST NURSE NOTE: pt c/o right lateral rib pain, worse with movement and deep breathing, states his PCP has been treating him for URI.the patient is in NAD on arrival.

## 2017-06-16 NOTE — ED Triage Notes (Signed)
Pt to ED reporting cough and URI infection for the past week with gradual onset of right sided rib pains that worsens when coughing. PT reports he was seen at PCP and given three prescriptions   Flonase , hydrocodone,

## 2017-06-18 ENCOUNTER — Telehealth: Payer: Self-pay | Admitting: Family Medicine

## 2017-06-18 NOTE — Telephone Encounter (Signed)
Pt states he was dxed with pneumonia in ED on 2/10.  States it is in middle lobe of right lung.  Pt has New Pt Appt  On 07/19/17.  Pt is requesting to be seen sooner due to the ED saying he needed f/u within 2 days.

## 2017-06-19 NOTE — Telephone Encounter (Signed)
Can move his NP appt to 8am on Mon Feb 18th.  Thanks  Erasmo DownerBacigalupo, Angela M, MD, MPH [facility] 06/19/2017 4:58 PM

## 2017-06-19 NOTE — Telephone Encounter (Signed)
Please advise 

## 2017-06-20 NOTE — Telephone Encounter (Signed)
Patient scheduled for 06/24/2017 at 8am

## 2017-06-20 NOTE — Telephone Encounter (Signed)
Lmtcb. Put hold on that appointment time.

## 2017-06-22 DIAGNOSIS — J181 Lobar pneumonia, unspecified organism: Secondary | ICD-10-CM | POA: Diagnosis not present

## 2017-06-24 ENCOUNTER — Ambulatory Visit (INDEPENDENT_AMBULATORY_CARE_PROVIDER_SITE_OTHER): Payer: BLUE CROSS/BLUE SHIELD | Admitting: Family Medicine

## 2017-06-24 ENCOUNTER — Encounter: Payer: Self-pay | Admitting: Family Medicine

## 2017-06-24 VITALS — BP 118/72 | HR 72 | Temp 97.9°F | Resp 16 | Ht 68.0 in | Wt 152.0 lb

## 2017-06-24 DIAGNOSIS — K219 Gastro-esophageal reflux disease without esophagitis: Secondary | ICD-10-CM | POA: Diagnosis not present

## 2017-06-24 DIAGNOSIS — J181 Lobar pneumonia, unspecified organism: Secondary | ICD-10-CM

## 2017-06-24 DIAGNOSIS — K769 Liver disease, unspecified: Secondary | ICD-10-CM

## 2017-06-24 DIAGNOSIS — J189 Pneumonia, unspecified organism: Secondary | ICD-10-CM | POA: Insufficient documentation

## 2017-06-24 NOTE — Progress Notes (Signed)
Patient: Craig Bowen, Male    DOB: 09-19-1965, 52 y.o.   MRN: [id number] Visit Date: 06/24/2017  Today's Provider: Shirlee Latch, MD   I, Joslyn Hy, CMA, am acting as scribe for Shirlee Latch, MD.  Chief Complaint  Patient presents with  . Establish Care  . Pneumonia   Subjective:    Establish Care Craig Bowen is a 52 y.o. male who presents today for health maintenance and to establish care. He feels fairly well. He reports [place] 6 [place] States he likes to swim. He reports he is normally sleeping well, but his cough keeps him awake..  -----------------------------------------------------------------  Follow up ER visit  Patient was seen in ER for chest pain/cough on 06/16/2017. He was treated for CAP. Treatment for this included Z Pak and Norco. He was not improving, so he went to [facility] on 06/22/2017, and was prescribed Levaquin, Tessalon Perles, Tussionex, Prednisone and Dulera. He reports fair compliance with treatment. He was advised to wait before taking the [facility], and he has not started the Prednisone. He reports this condition is Improved. He states he is about 50% improved.  Breathing well, denies SOB.  Able to sleep through the night last night.  On day 3/7 of Levaquin.  Continues to have R rib pain with deep inspiration, fatigue, and mild cough.  ------------------------------------------------------------------------------------  GERD: Taking Omeprazole 20mg  daily prn.  Only needs to take about 6 times per year.  Watching what he eats (avoids spicy foods) and avoids eating late at night  Ortho: Had L should surgery for bone spurs.  Never got better and then needed anchor revision in L shoulder 2 spinal surgeries for bone spurs (T11-S1, Cervical fusion) Swims 6 days for week which seems to help with joint pain. On medical leave for cervical spine issue from [employer] - want him to be entirely pain free  before flying again. Receiving PT for shoulder issue Was following with Dr Marin Shutter at [facility]  S/p vasectomy  H/o night sweats, leukopenia, pulmonary nodules, and hepatic lesion: was worked up by Oncology for night sweats.  Developed angioedema to NSAIDs.  Had mild leukopenia.  Was worked up for possible lymphoma, but no evidence was found.  These symptoms have all resolved. CT to look for lymphoma found incidental hepatic lesion and pulmonary nodules.  Reviewed records   Review of Systems  Constitutional: Negative.   HENT: Negative.   Eyes: Negative.   Respiratory: Negative.   Cardiovascular: Negative.   Gastrointestinal: Negative.   Endocrine: Negative.   Genitourinary: Negative.   Musculoskeletal: Negative.   Skin: Negative.   Allergic/Immunologic: Negative.   Neurological: Negative.   Hematological: Negative.   Psychiatric/Behavioral: Negative.     Social History      He  reports that  has never smoked. he has never used smokeless tobacco. He reports that he does not drink alcohol or use drugs.       Social History   Socioeconomic History  . Marital status: Divorced    Spouse name: None  . Number of children: 3  . Years of education: 16  . Highest education level: Bachelor's degree (e.g., BA, AB, BS)  Social Needs  . Financial resource strain: Not very hard  . Food insecurity - worry: Never true  . Food insecurity - inability: Never true  . Transportation needs - medical: No  . Transportation needs - non-medical: No  Occupational History  .  Occupation: PILOT    [employer]er: [employer]    Comment: currently on medical leave for cervical spine  Tobacco Use  . Smoking status: Never Smoker  . Smokeless tobacco: Never Used  Substance and Sexual Activity  . Alcohol use: No    Alcohol/week: 0.0 oz  . Drug use: No  . Sexual activity: Yes    Partners: Female    Birth control/protection: Surgical  Other Topics Concern  . None  Social History Narrative  . None     Past Medical History:  Diagnosis Date  . GERD (gastroesophageal reflux disease)   . Tinnitus    on 10% disability with [employer] (was in [employer]) for tinnitus     Patient Active Problem List   Diagnosis Date Noted  . CAP (community acquired pneumonia) 06/24/2017  . Acid reflux 08/27/2013  . Hepatic lesion 03/12/2011    Past Surgical History:  Procedure Laterality Date  . BICEPS TENODESIS Left 12/2014   and 09/2016  . NECK SURGERY    . SHOULDER SURGERY Left august 2016  . SPINE SURGERY    . VASECTOMY      Family History        Family Status  Relation Name Status  . Mother  Alive  . Father  Deceased       accidental death before pt was born  . Sister  Alive  . Brother  Alive  . PGF  (Not Specified)  . Sister Half Sister Alive  . MGM  (Not Specified)  . Neg Hx  (Not Specified)        His family history includes Healthy in his brother, mother, sister, and sister; Hypertension in his maternal grandmother; Leukemia in his paternal grandfather. There is no history of Colon cancer or Prostate cancer.      Allergies  Allergen Reactions  . Ketorolac Swelling    Face and lip swelling s/p injection  . Naproxen Swelling    Swelling of tongue, lips, jaw  . Nsaids Swelling    Face and tongue swelling with naprosyn and toradol     Current Outpatient Medications:  .  benzonatate (TESSALON) 200 MG capsule, Take by mouth., Disp: , Rfl:  .  chlorpheniramine-HYDROcodone (TUSSIONEX) 10-8 MG/5ML SUER, Take by mouth., Disp: , Rfl:  .  levofloxacin (LEVAQUIN) 500 MG tablet, Take by mouth., Disp: , Rfl:  .  Multiple Vitamin (MULTIVITAMIN WITH MINERALS) TABS tablet, Take 1 tablet by mouth daily., Disp: , Rfl:  .  omeprazole (PRILOSEC) 20 MG capsule, Take 20 mg by mouth daily as needed., Disp: , Rfl:    Patient Care Team: Erasmo DownerBacigalupo, Angela M, MD as PCP - General (Family Medicine)      Objective:   Vitals: BP 118/72 (BP Location: Left Arm, Patient Position: Sitting, Cuff  Size: Normal)   Pulse 72   Temp 97.9 F (36.6 C) (Oral)   Resp 16   Ht 5\' 8"  (1.727 m)   Wt 152 lb (68.9 kg)   SpO2 99%   BMI 23.11 kg/m    Vitals:   06/24/17 0816  BP: 118/72  Pulse: 72  Resp: 16  Temp: 97.9 F (36.6 C)  TempSrc: Oral  SpO2: 99%  Weight: 152 lb (68.9 kg)  Height: 5\' 8"  (1.727 m)     Physical Exam  Constitutional: He is oriented to person, place, and time. He appears well-developed and well-nourished. No distress.  HENT:  Head: Normocephalic and atraumatic.  Right Ear: External ear normal.  Left Ear:  External ear normal.  Nose: Nose normal.  Mouth/Throat: Oropharynx is clear and moist.  Eyes: Conjunctivae and EOM are normal. Pupils are equal, round, and reactive to light. No scleral icterus.  Neck: Neck supple. No thyromegaly present.  Cardiovascular: Normal rate, regular rhythm, normal heart sounds and intact distal pulses.  No murmur heard. Pulmonary/Chest: Effort normal. No respiratory distress. He has no wheezes. He has rales (in RML).  Abdominal: Soft. Bowel sounds are normal. He exhibits no distension. There is no tenderness. There is no rebound and no guarding.  Musculoskeletal: He exhibits no edema or deformity.  Lymphadenopathy:    He has no cervical adenopathy.  Neurological: He is alert and oriented to person, place, and time.  Skin: Skin is warm and dry. No rash noted.  Psychiatric: He has a normal mood and affect. His behavior is normal.  Vitals reviewed.    Depression Screen PHQ 2/9 Scores 06/24/2017 03/28/2015  PHQ - 2 Score 0 0     Assessment & Plan:      Problem List Items Addressed This Visit      Respiratory   CAP (community acquired pneumonia)    Seems to be improving greatly Discussed natural course of illness Okay to finish Levaquin as prescribed Does not seem to need Dulera or prednisone Return precautions discussed      Relevant Medications   benzonatate (TESSALON) 200 MG capsule    chlorpheniramine-HYDROcodone (TUSSIONEX) 10-8 MG/5ML SUER   levofloxacin (LEVAQUIN) 500 MG tablet     Digestive   Acid reflux - Primary    Well-controlled with rare symptoms Okay to continue taking PPI every 2 months as needed      Relevant Medications   omeprazole (PRILOSEC) 20 MG capsule     Other   Hepatic lesion    Has previously been worked up by oncology and was told that these incidental lesions are benign          Return in about 6 months (around 12/22/2017) for physical.  We will request records from last colonoscopy from [facility]  The entirety of the information documented in the History of Present Illness, Review of Systems and Physical Exam were personally obtained by me. Portions of this information were initially documented by Irving Burton Ratchford, CMA and reviewed by me for thoroughness and accuracy.    Erasmo Downer, MD, MPH [facility] 06/24/2017 5:01 PM

## 2017-06-24 NOTE — Assessment & Plan Note (Signed)
Seems to be improving greatly Discussed natural course of illness Okay to finish Levaquin as prescribed Does not seem to need Dulera or prednisone Return precautions discussed

## 2017-06-24 NOTE — Assessment & Plan Note (Signed)
Well-controlled with rare symptoms Okay to continue taking PPI every 2 months as needed

## 2017-06-24 NOTE — Assessment & Plan Note (Signed)
Has previously been worked up by oncology and was told that these incidental lesions are benign

## 2017-06-25 DIAGNOSIS — K219 Gastro-esophageal reflux disease without esophagitis: Secondary | ICD-10-CM | POA: Diagnosis not present

## 2017-06-25 DIAGNOSIS — Z23 Encounter for immunization: Secondary | ICD-10-CM | POA: Diagnosis not present

## 2017-06-25 DIAGNOSIS — D72819 Decreased white blood cell count, unspecified: Secondary | ICD-10-CM | POA: Diagnosis not present

## 2017-06-25 DIAGNOSIS — E559 Vitamin D deficiency, unspecified: Secondary | ICD-10-CM | POA: Diagnosis not present

## 2017-06-25 DIAGNOSIS — J189 Pneumonia, unspecified organism: Secondary | ICD-10-CM | POA: Diagnosis not present

## 2017-06-25 LAB — LIPID PANEL
Cholesterol: 165 (ref 0–200)
HDL: 60 (ref 35–70)
LDL CALC: 105
Triglycerides: 40 (ref 40–160)

## 2017-06-25 LAB — CBC AND DIFFERENTIAL
HCT: 47 (ref 41–53)
Hemoglobin: 15.9 (ref 13.5–17.5)
PLATELETS: 251 (ref 150–399)
WBC: 7.5

## 2017-06-25 LAB — BASIC METABOLIC PANEL
BUN: 14 (ref 4–21)
Creatinine: 0.8 (ref 0.6–1.3)
GLUCOSE: 77
Potassium: 4.2 (ref 3.4–5.3)
Sodium: 138 (ref 137–147)

## 2017-06-25 LAB — HEPATIC FUNCTION PANEL
ALT: 33 (ref 10–40)
AST: 25 (ref 14–40)
Alkaline Phosphatase: 65 (ref 25–125)
Bilirubin, Total: 0.3

## 2017-06-25 LAB — VITAMIN D 25 HYDROXY (VIT D DEFICIENCY, FRACTURES): VIT D 25 HYDROXY: 36.8

## 2017-06-27 DIAGNOSIS — J189 Pneumonia, unspecified organism: Secondary | ICD-10-CM | POA: Diagnosis not present

## 2017-06-27 DIAGNOSIS — E559 Vitamin D deficiency, unspecified: Secondary | ICD-10-CM | POA: Diagnosis not present

## 2017-06-28 DIAGNOSIS — M25512 Pain in left shoulder: Secondary | ICD-10-CM | POA: Diagnosis not present

## 2017-07-02 DIAGNOSIS — M25512 Pain in left shoulder: Secondary | ICD-10-CM | POA: Diagnosis not present

## 2017-07-09 DIAGNOSIS — M25512 Pain in left shoulder: Secondary | ICD-10-CM | POA: Diagnosis not present

## 2017-07-12 DIAGNOSIS — M25512 Pain in left shoulder: Secondary | ICD-10-CM | POA: Diagnosis not present

## 2017-07-15 DIAGNOSIS — Z23 Encounter for immunization: Secondary | ICD-10-CM | POA: Diagnosis not present

## 2017-07-19 ENCOUNTER — Ambulatory Visit: Payer: BLUE CROSS/BLUE SHIELD | Admitting: Family Medicine

## 2017-07-19 DIAGNOSIS — M25512 Pain in left shoulder: Secondary | ICD-10-CM | POA: Diagnosis not present

## 2017-07-23 DIAGNOSIS — M25512 Pain in left shoulder: Secondary | ICD-10-CM | POA: Diagnosis not present

## 2017-07-24 DIAGNOSIS — M542 Cervicalgia: Secondary | ICD-10-CM | POA: Diagnosis not present

## 2017-07-26 DIAGNOSIS — M25512 Pain in left shoulder: Secondary | ICD-10-CM | POA: Diagnosis not present

## 2017-07-29 DIAGNOSIS — M25512 Pain in left shoulder: Secondary | ICD-10-CM | POA: Diagnosis not present

## 2017-08-01 DIAGNOSIS — M25512 Pain in left shoulder: Secondary | ICD-10-CM | POA: Diagnosis not present

## 2017-08-06 DIAGNOSIS — M25512 Pain in left shoulder: Secondary | ICD-10-CM | POA: Diagnosis not present

## 2017-08-08 DIAGNOSIS — M25512 Pain in left shoulder: Secondary | ICD-10-CM | POA: Diagnosis not present

## 2017-08-20 DIAGNOSIS — M25512 Pain in left shoulder: Secondary | ICD-10-CM | POA: Diagnosis not present

## 2017-08-28 DIAGNOSIS — M7522 Bicipital tendinitis, left shoulder: Secondary | ICD-10-CM | POA: Diagnosis not present

## 2017-08-28 DIAGNOSIS — S4432XD Injury of axillary nerve, left arm, subsequent encounter: Secondary | ICD-10-CM | POA: Diagnosis not present

## 2017-08-30 DIAGNOSIS — M47812 Spondylosis without myelopathy or radiculopathy, cervical region: Secondary | ICD-10-CM | POA: Diagnosis not present

## 2017-08-30 DIAGNOSIS — M4802 Spinal stenosis, cervical region: Secondary | ICD-10-CM | POA: Diagnosis not present

## 2017-09-06 DIAGNOSIS — M25512 Pain in left shoulder: Secondary | ICD-10-CM | POA: Diagnosis not present

## 2017-09-10 DIAGNOSIS — M25512 Pain in left shoulder: Secondary | ICD-10-CM | POA: Diagnosis not present

## 2017-09-17 ENCOUNTER — Other Ambulatory Visit: Payer: Self-pay | Admitting: Neurosurgery

## 2017-09-17 DIAGNOSIS — M47812 Spondylosis without myelopathy or radiculopathy, cervical region: Secondary | ICD-10-CM

## 2017-09-17 DIAGNOSIS — M4684 Other specified inflammatory spondylopathies, thoracic region: Secondary | ICD-10-CM

## 2017-09-17 DIAGNOSIS — M25512 Pain in left shoulder: Secondary | ICD-10-CM | POA: Diagnosis not present

## 2017-10-08 ENCOUNTER — Ambulatory Visit: Payer: BLUE CROSS/BLUE SHIELD

## 2017-10-09 DIAGNOSIS — M25512 Pain in left shoulder: Secondary | ICD-10-CM | POA: Diagnosis not present

## 2017-10-09 DIAGNOSIS — E559 Vitamin D deficiency, unspecified: Secondary | ICD-10-CM | POA: Diagnosis not present

## 2017-10-09 DIAGNOSIS — M542 Cervicalgia: Secondary | ICD-10-CM | POA: Diagnosis not present

## 2017-10-09 DIAGNOSIS — T783XXD Angioneurotic edema, subsequent encounter: Secondary | ICD-10-CM | POA: Diagnosis not present

## 2017-10-22 ENCOUNTER — Ambulatory Visit: Payer: BLUE CROSS/BLUE SHIELD

## 2017-10-22 DIAGNOSIS — M25512 Pain in left shoulder: Secondary | ICD-10-CM | POA: Diagnosis not present

## 2017-10-23 DIAGNOSIS — M4325 Fusion of spine, thoracolumbar region: Secondary | ICD-10-CM | POA: Diagnosis not present

## 2017-10-23 DIAGNOSIS — Z981 Arthrodesis status: Secondary | ICD-10-CM | POA: Diagnosis not present

## 2017-10-23 DIAGNOSIS — M4802 Spinal stenosis, cervical region: Secondary | ICD-10-CM | POA: Diagnosis not present

## 2017-11-05 DIAGNOSIS — M542 Cervicalgia: Secondary | ICD-10-CM | POA: Diagnosis not present

## 2017-11-18 ENCOUNTER — Ambulatory Visit: Payer: Self-pay | Admitting: Allergy & Immunology

## 2017-11-21 DIAGNOSIS — M25512 Pain in left shoulder: Secondary | ICD-10-CM | POA: Diagnosis not present

## 2017-11-29 DIAGNOSIS — L821 Other seborrheic keratosis: Secondary | ICD-10-CM | POA: Diagnosis not present

## 2017-11-29 DIAGNOSIS — L918 Other hypertrophic disorders of the skin: Secondary | ICD-10-CM | POA: Diagnosis not present

## 2017-12-24 ENCOUNTER — Encounter: Payer: Self-pay | Admitting: Family Medicine

## 2018-01-10 DIAGNOSIS — M542 Cervicalgia: Secondary | ICD-10-CM | POA: Diagnosis not present

## 2018-01-10 DIAGNOSIS — Z981 Arthrodesis status: Secondary | ICD-10-CM | POA: Diagnosis not present

## 2018-01-10 DIAGNOSIS — G8929 Other chronic pain: Secondary | ICD-10-CM | POA: Diagnosis not present

## 2018-01-22 DIAGNOSIS — M47812 Spondylosis without myelopathy or radiculopathy, cervical region: Secondary | ICD-10-CM | POA: Diagnosis not present

## 2018-01-27 DIAGNOSIS — M5134 Other intervertebral disc degeneration, thoracic region: Secondary | ICD-10-CM | POA: Diagnosis not present

## 2018-02-11 DIAGNOSIS — R3915 Urgency of urination: Secondary | ICD-10-CM | POA: Diagnosis not present

## 2018-02-11 DIAGNOSIS — N401 Enlarged prostate with lower urinary tract symptoms: Secondary | ICD-10-CM | POA: Diagnosis not present

## 2018-02-12 DIAGNOSIS — G4489 Other headache syndrome: Secondary | ICD-10-CM | POA: Diagnosis not present

## 2018-02-12 DIAGNOSIS — M47812 Spondylosis without myelopathy or radiculopathy, cervical region: Secondary | ICD-10-CM | POA: Diagnosis not present

## 2018-02-13 ENCOUNTER — Other Ambulatory Visit: Payer: Self-pay | Admitting: Neurosurgery

## 2018-02-13 DIAGNOSIS — G4489 Other headache syndrome: Secondary | ICD-10-CM

## 2018-02-26 ENCOUNTER — Ambulatory Visit
Admission: RE | Admit: 2018-02-26 | Discharge: 2018-02-26 | Disposition: A | Discharge status: Home / Self Care | Payer: BLUE CROSS/BLUE SHIELD | Source: Ambulatory Visit | Attending: Neurosurgery | Admitting: Neurosurgery

## 2018-02-26 DIAGNOSIS — G4452 New daily persistent headache (NDPH): Secondary | ICD-10-CM | POA: Diagnosis not present

## 2018-02-26 DIAGNOSIS — G4489 Other headache syndrome: Secondary | ICD-10-CM | POA: Insufficient documentation

## 2018-04-02 DIAGNOSIS — M75112 Incomplete rotator cuff tear or rupture of left shoulder, not specified as traumatic: Secondary | ICD-10-CM | POA: Diagnosis not present

## 2018-06-11 DIAGNOSIS — R0789 Other chest pain: Secondary | ICD-10-CM | POA: Diagnosis not present

## 2018-06-11 DIAGNOSIS — R0781 Pleurodynia: Secondary | ICD-10-CM | POA: Diagnosis not present

## 2018-06-17 DIAGNOSIS — X58XXXD Exposure to other specified factors, subsequent encounter: Secondary | ICD-10-CM | POA: Diagnosis not present

## 2018-06-17 DIAGNOSIS — M542 Cervicalgia: Secondary | ICD-10-CM | POA: Diagnosis not present

## 2018-06-17 DIAGNOSIS — E559 Vitamin D deficiency, unspecified: Secondary | ICD-10-CM | POA: Diagnosis not present

## 2018-06-17 DIAGNOSIS — M7542 Impingement syndrome of left shoulder: Secondary | ICD-10-CM | POA: Diagnosis not present

## 2018-06-17 DIAGNOSIS — T783XXD Angioneurotic edema, subsequent encounter: Secondary | ICD-10-CM | POA: Diagnosis not present

## 2018-06-17 DIAGNOSIS — G8929 Other chronic pain: Secondary | ICD-10-CM | POA: Diagnosis not present

## 2018-06-17 DIAGNOSIS — K219 Gastro-esophageal reflux disease without esophagitis: Secondary | ICD-10-CM | POA: Diagnosis not present

## 2018-07-06 IMAGING — MR MR CERVICAL SPINE W/O CM
5 series · 35 of 48 positions shown · non-contrast
Comparison: 05/11/2009

CLINICAL DATA: Cervical radicular pain. Burning and tingling in the
forearms and small fingers bilaterally for 1 week. Neck pain for 3
years. Prior cervical spine surgery.

EXAM:
MRI CERVICAL SPINE WITHOUT CONTRAST
TECHNIQUE: Multiplanar, multisequence MR imaging of the cervical spine was
performed. No intravenous contrast was administered.

[Series 3: T2 · sagittal · 3.0mm · 0.70mm/px · 8 of 15 slices shown (1 of 2)]
[im 1/15]
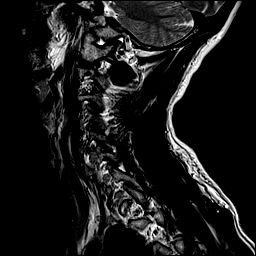
[im 3/15]
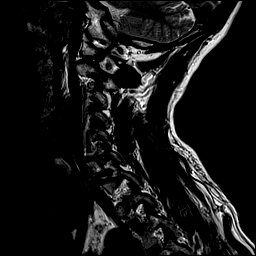
[im 5/15]
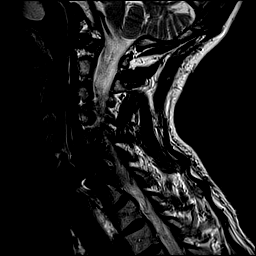
[im 7/15]
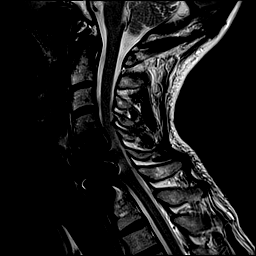
[im 9/15]
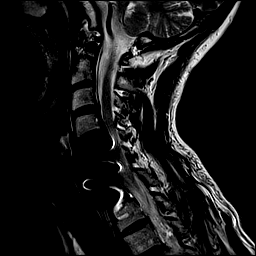
[im 11/15]
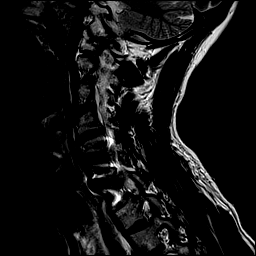
[im 13/15]
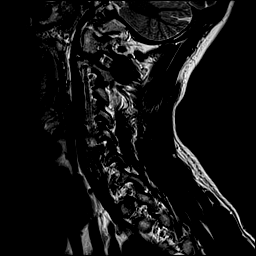
[im 15/15]
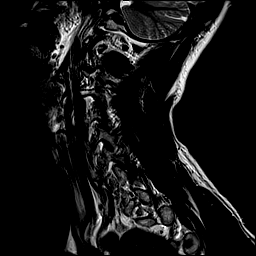

[Series 4: T1 · sagittal · 3.0mm · 0.70mm/px · 7 of 15 slices shown]
[im 1/15]
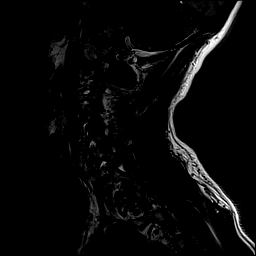
[im 3/15]
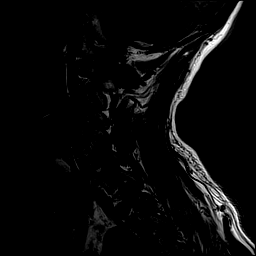
[im 5/15]
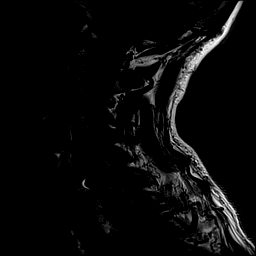
[im 8/15]
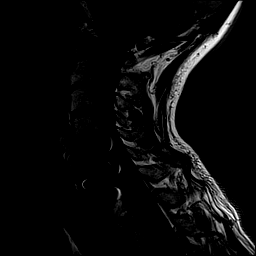
[im 10/15]
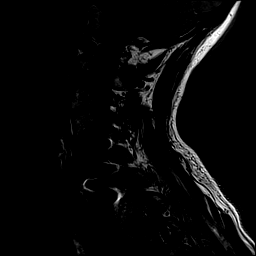
[im 12/15]
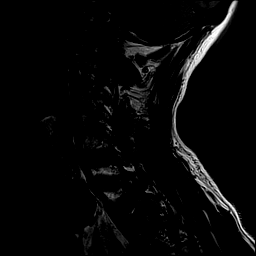
[im 15/15]
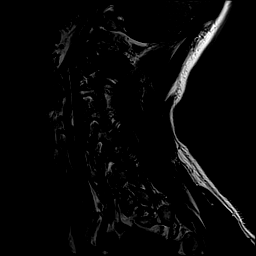

[Series 5: STIR · sagittal · 3.0mm · 0.35mm/px · 7 of 15 slices shown]
[im 1/15]
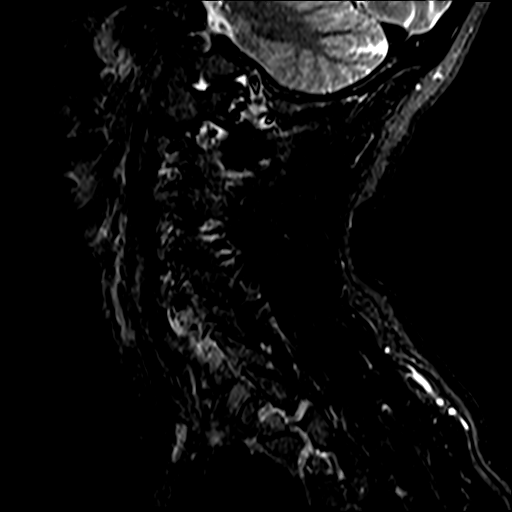
[im 3/15]
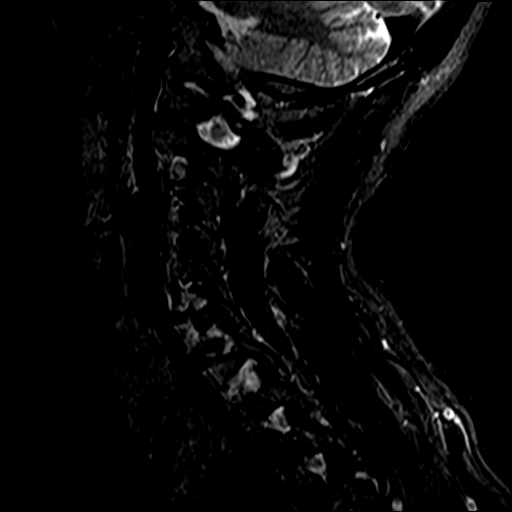
[im 5/15]
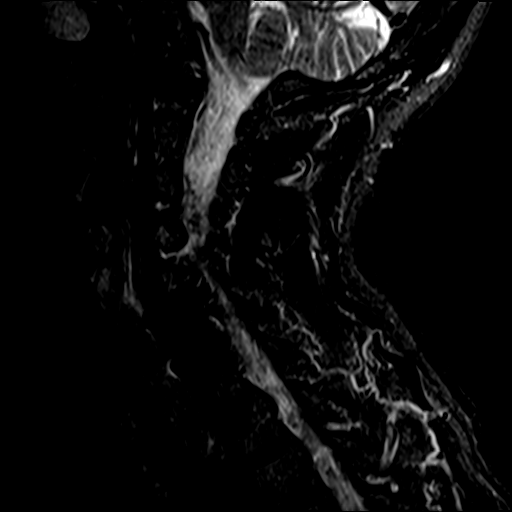
[im 8/15]
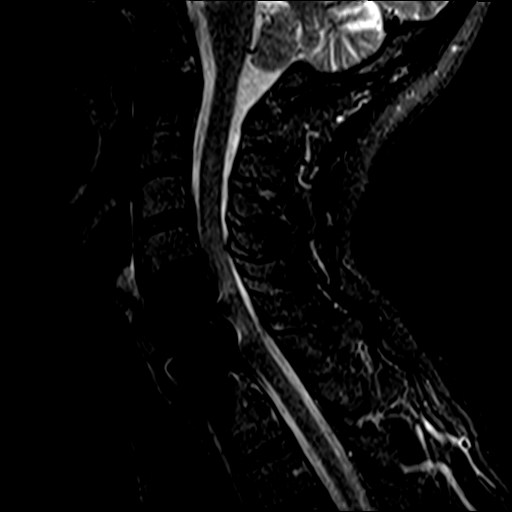
[im 10/15]
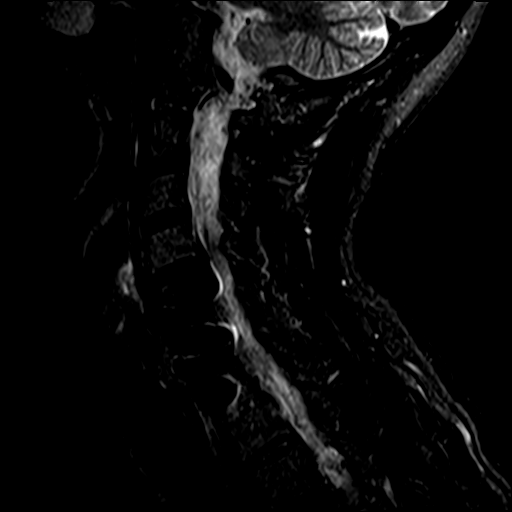
[im 12/15]
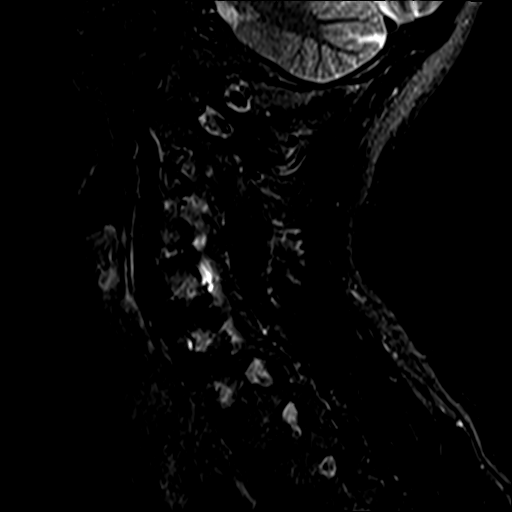
[im 15/15]
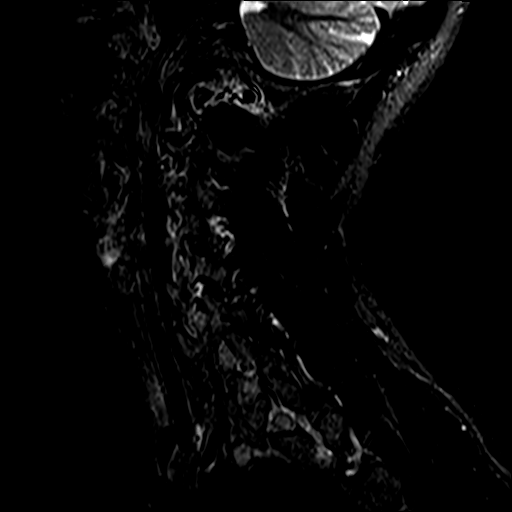

[Series 6: T2 · axial · 3.0mm · 0.70mm/px · z∈[-24,+75]mm · 9 of 27 slices shown (2 of 2)]
[im 1/27]
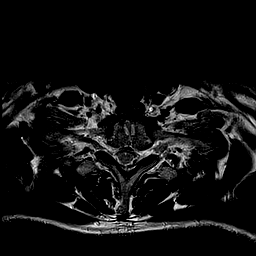
[im 5/27]
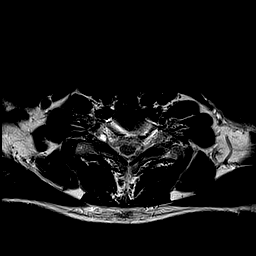
[im 9/27]
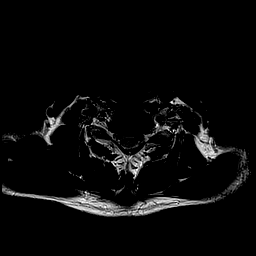
[im 11/27]
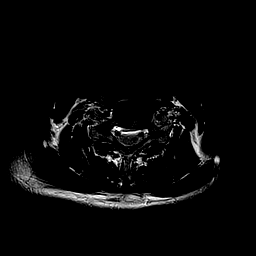
[im 14/27]
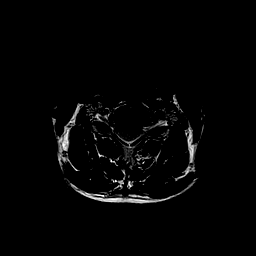
[im 16/27]
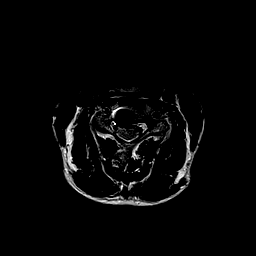
[im 18/27]
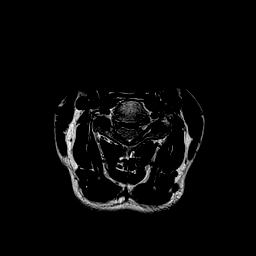
[im 22/27]
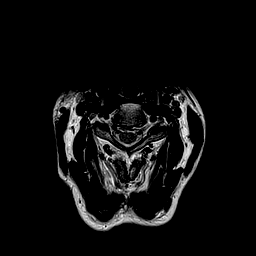
[im 27/27]
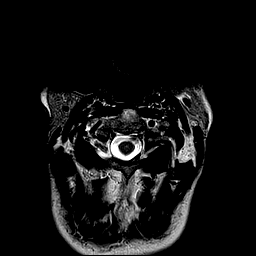

[Series 7: mpgr ax · axial · 3.0mm · 0.35mm/px · z∈[-24,+14]mm · 4 of 27 slices shown]
[im 1/27]
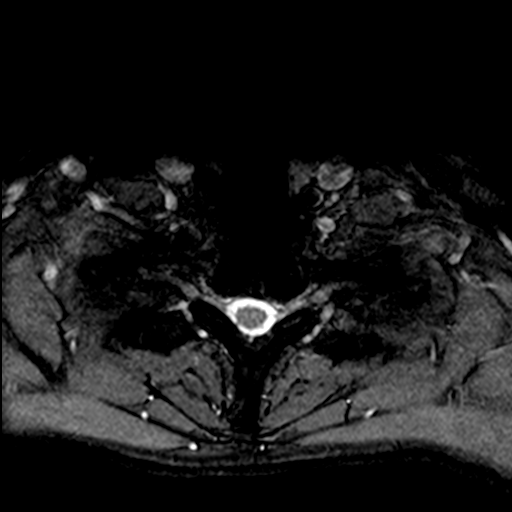
[im 5/27]
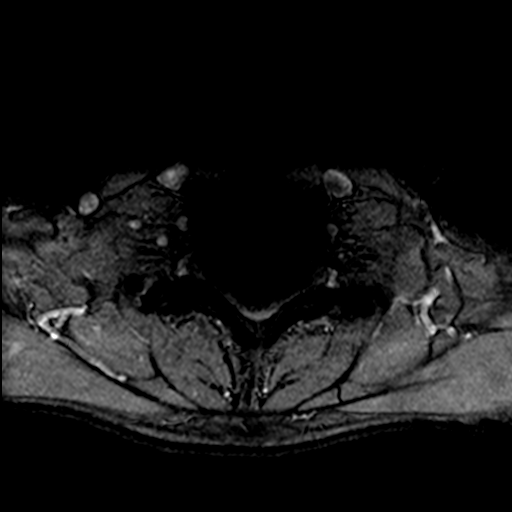
[im 9/27]
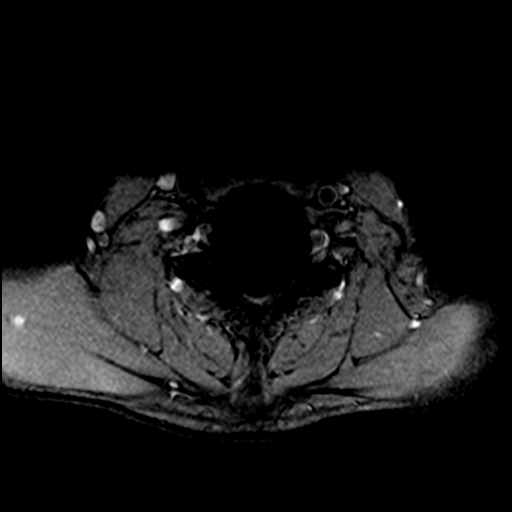
[im 11/27]
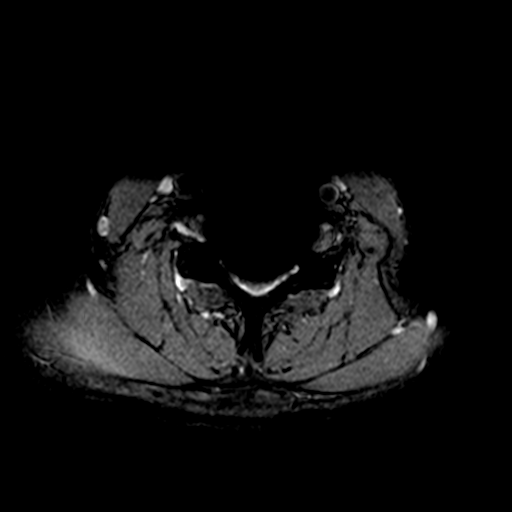

[35 of 48 positions shown; findings below may reference images not displayed]

FINDINGS: Alignment: Normal alignment from C1-C5. Limited assessment from
C5-T1 due to magnetic susceptibility artifact.

Vertebrae: Interval anterior fusion from C5-C7 with prominent
magnetic susceptibility artifact. Remote C7-T1 anterior fusion with
interval removal of the plate and screws at T1. No evidence of
fracture, focal osseous lesion, or significant marrow edema.

Cord: Normal signal and morphology, with evaluation mildly limited
at C5-6.

Posterior Fossa, vertebral arteries, paraspinal tissues:
Unremarkable.

Disc levels:

C2-3:  Mild left uncovertebral spurring without stenosis, unchanged.

C3-4: Small central disc protrusion is stable to minimally larger
than on the prior study. No associated stenosis or spinal cord mass
effect.

C4-5: Spinal canal is obscured on axial gradient echo images due to
magnetic susceptibility artifact. Disc bulging results in mild
spinal stenosis, stable to minimally increased from prior. No
compressive neural foraminal stenosis.

C5-6: Interval ACDF. No residual spinal stenosis. No evidence of
residual compressive neural foraminal stenosis within limitations of
artifact.

C6-7: Interval ACDF. No residual spinal stenosis. Right
uncovertebral spurring without evidence of compressive neural
foraminal stenosis.

C7-T1:  Remote ACDF.  No residual stenosis.
IMPRESSION: 1. Interval C5-C7 ACDF with prominent susceptibility artifact. No
residual spinal stenosis or compressive neural foraminal stenosis.
2. Mild spinal stenosis at C4-5, stable to minimally increased.
3. Small central disc protrusion at C3-4 without stenosis.

## 2018-11-10 DIAGNOSIS — M47812 Spondylosis without myelopathy or radiculopathy, cervical region: Secondary | ICD-10-CM | POA: Diagnosis not present

## 2018-11-10 DIAGNOSIS — M542 Cervicalgia: Secondary | ICD-10-CM | POA: Diagnosis not present

## 2018-11-26 DIAGNOSIS — M5030 Other cervical disc degeneration, unspecified cervical region: Secondary | ICD-10-CM | POA: Diagnosis not present

## 2018-11-26 DIAGNOSIS — M47892 Other spondylosis, cervical region: Secondary | ICD-10-CM | POA: Diagnosis not present

## 2018-11-26 DIAGNOSIS — M542 Cervicalgia: Secondary | ICD-10-CM | POA: Diagnosis not present

## 2018-12-18 DIAGNOSIS — J3801 Paralysis of vocal cords and larynx, unilateral: Secondary | ICD-10-CM | POA: Diagnosis not present

## 2018-12-18 DIAGNOSIS — J387 Other diseases of larynx: Secondary | ICD-10-CM | POA: Diagnosis not present

## 2019-02-17 ENCOUNTER — Other Ambulatory Visit: Payer: Self-pay | Admitting: Orthopedic Surgery

## 2019-02-17 DIAGNOSIS — M47892 Other spondylosis, cervical region: Secondary | ICD-10-CM

## 2019-02-17 DIAGNOSIS — M47812 Spondylosis without myelopathy or radiculopathy, cervical region: Secondary | ICD-10-CM

## 2019-02-17 DIAGNOSIS — M40292 Other kyphosis, cervical region: Secondary | ICD-10-CM

## 2019-02-17 DIAGNOSIS — M5030 Other cervical disc degeneration, unspecified cervical region: Secondary | ICD-10-CM

## 2019-02-17 DIAGNOSIS — M438X2 Other specified deforming dorsopathies, cervical region: Secondary | ICD-10-CM

## 2019-02-17 DIAGNOSIS — M5412 Radiculopathy, cervical region: Secondary | ICD-10-CM

## 2019-02-17 DIAGNOSIS — Z981 Arthrodesis status: Secondary | ICD-10-CM

## 2019-02-18 ENCOUNTER — Other Ambulatory Visit: Payer: Self-pay

## 2019-02-18 ENCOUNTER — Ambulatory Visit
Admission: RE | Admit: 2019-02-18 | Discharge: 2019-02-18 | Disposition: A | Discharge status: Home / Self Care | Payer: BC Managed Care – PPO | Source: Ambulatory Visit | Attending: Orthopedic Surgery | Admitting: Orthopedic Surgery

## 2019-02-18 DIAGNOSIS — M47812 Spondylosis without myelopathy or radiculopathy, cervical region: Secondary | ICD-10-CM | POA: Diagnosis present

## 2019-02-18 DIAGNOSIS — M47892 Other spondylosis, cervical region: Secondary | ICD-10-CM | POA: Diagnosis present

## 2019-02-18 DIAGNOSIS — M438X2 Other specified deforming dorsopathies, cervical region: Secondary | ICD-10-CM

## 2019-02-18 DIAGNOSIS — M40292 Other kyphosis, cervical region: Secondary | ICD-10-CM | POA: Diagnosis not present

## 2019-02-18 DIAGNOSIS — Z981 Arthrodesis status: Secondary | ICD-10-CM | POA: Diagnosis present

## 2019-02-18 DIAGNOSIS — M5030 Other cervical disc degeneration, unspecified cervical region: Secondary | ICD-10-CM

## 2019-02-18 DIAGNOSIS — M5412 Radiculopathy, cervical region: Secondary | ICD-10-CM | POA: Insufficient documentation

## 2019-10-01 ENCOUNTER — Ambulatory Visit
Admission: RE | Admit: 2019-10-01 | Discharge: 2019-10-01 | Disposition: A | Discharge status: Home / Self Care | Payer: BC Managed Care – PPO | Source: Ambulatory Visit | Attending: Orthopedic Surgery | Admitting: Orthopedic Surgery

## 2019-10-01 ENCOUNTER — Other Ambulatory Visit: Payer: Self-pay | Admitting: Orthopedic Surgery

## 2019-10-01 ENCOUNTER — Other Ambulatory Visit: Payer: Self-pay

## 2019-10-01 DIAGNOSIS — Z981 Arthrodesis status: Secondary | ICD-10-CM | POA: Diagnosis present

## 2019-11-03 ENCOUNTER — Other Ambulatory Visit: Payer: Self-pay | Admitting: Orthopedic Surgery

## 2019-11-03 DIAGNOSIS — M5412 Radiculopathy, cervical region: Secondary | ICD-10-CM

## 2019-11-19 ENCOUNTER — Other Ambulatory Visit: Payer: Self-pay

## 2019-11-19 ENCOUNTER — Ambulatory Visit
Admission: RE | Admit: 2019-11-19 | Discharge: 2019-11-19 | Disposition: A | Discharge status: Home / Self Care | Payer: BC Managed Care – PPO | Source: Ambulatory Visit | Attending: Orthopedic Surgery | Admitting: Orthopedic Surgery

## 2019-11-19 DIAGNOSIS — M5412 Radiculopathy, cervical region: Secondary | ICD-10-CM | POA: Insufficient documentation

## 2021-10-10 ENCOUNTER — Other Ambulatory Visit: Payer: Self-pay | Admitting: Orthopedic Surgery

## 2021-10-10 DIAGNOSIS — M47814 Spondylosis without myelopathy or radiculopathy, thoracic region: Secondary | ICD-10-CM

## 2021-10-10 DIAGNOSIS — M5412 Radiculopathy, cervical region: Secondary | ICD-10-CM

## 2023-01-08 ENCOUNTER — Encounter
Admission: RE | Disposition: A | Discharge status: Home / Self Care | Payer: Self-pay | Source: Home / Self Care | Attending: Gastroenterology

## 2023-01-08 ENCOUNTER — Ambulatory Visit: Payer: No Typology Code available for payment source | Admitting: Certified Registered"

## 2023-01-08 ENCOUNTER — Ambulatory Visit: Payer: No Typology Code available for payment source

## 2023-01-08 ENCOUNTER — Ambulatory Visit
Admission: RE | Admit: 2023-01-08 | Discharge: 2023-01-08 | Disposition: A | Discharge status: Home / Self Care | Payer: No Typology Code available for payment source | Attending: Gastroenterology | Admitting: Gastroenterology

## 2023-01-08 ENCOUNTER — Encounter: Payer: Self-pay | Admitting: *Deleted

## 2023-01-08 DIAGNOSIS — K219 Gastro-esophageal reflux disease without esophagitis: Secondary | ICD-10-CM | POA: Diagnosis present

## 2023-01-08 DIAGNOSIS — K641 Second degree hemorrhoids: Secondary | ICD-10-CM | POA: Diagnosis not present

## 2023-01-08 DIAGNOSIS — Z1211 Encounter for screening for malignant neoplasm of colon: Secondary | ICD-10-CM | POA: Diagnosis present

## 2023-01-08 HISTORY — PX: ESOPHAGOGASTRODUODENOSCOPY (EGD) WITH PROPOFOL: SHX5813

## 2023-01-08 HISTORY — PX: COLONOSCOPY WITH PROPOFOL: SHX5780

## 2023-01-08 HISTORY — PX: BIOPSY: SHX5522

## 2023-01-08 SURGERY — COLONOSCOPY WITH PROPOFOL
Anesthesia: General

## 2023-01-08 MED ORDER — SODIUM CHLORIDE 0.9 % IV SOLN: INTRAVENOUS | Status: DC

## 2023-01-08 MED ORDER — LIDOCAINE HCL (CARDIAC) PF 100 MG/5ML IV SOSY
PREFILLED_SYRINGE | INTRAVENOUS | Status: DC | PRN
  Administered 2023-01-08: 80 mg via INTRAVENOUS

## 2023-01-08 MED ORDER — IBUPROFEN 400 MG PO TABS
400.0000 mg | ORAL_TABLET | Freq: Once | ORAL | Status: AC | PRN
  Administered 2023-01-08: 400 mg via ORAL
  Filled 2023-01-08: qty 1

## 2023-01-08 MED ORDER — SODIUM CHLORIDE 0.9 % IV SOLN: INTRAVENOUS | Status: DC | PRN

## 2023-01-08 MED ORDER — PROPOFOL 10 MG/ML IV BOLUS
INTRAVENOUS | Status: DC | PRN
  Administered 2023-01-08: 10 mg via INTRAVENOUS
  Administered 2023-01-08: 100 mg via INTRAVENOUS

## 2023-01-08 MED ORDER — PROPOFOL 500 MG/50ML IV EMUL
INTRAVENOUS | Status: DC | PRN
  Administered 2023-01-08: 150 ug/kg/min via INTRAVENOUS

## 2023-01-08 NOTE — Transfer of Care (Signed)
Immediate Anesthesia Transfer of Care Note  Patient: Craig Bowen  Procedure(s) Performed: COLONOSCOPY WITH PROPOFOL ESOPHAGOGASTRODUODENOSCOPY (EGD) WITH PROPOFOL  Patient Location: PACU  Anesthesia Type:General  Level of Consciousness: sedated  Airway & Oxygen Therapy: Patient Spontanous Breathing  Post-op Assessment: Report given to RN and Post -op Vital signs reviewed and stable  Post vital signs: Reviewed and stable  Last Vitals:  Vitals Value Taken Time  BP 93/66 01/08/23 0834  Temp 36.3 C 01/08/23 0829  Pulse 77 01/08/23 0834  Resp 24 01/08/23 0834  SpO2 92 % 01/08/23 0834  Vitals shown include unfiled device data.  Last Pain:  Vitals:   01/08/23 0829  TempSrc: Temporal  PainSc: Asleep         Complications: No notable events documented.

## 2023-01-08 NOTE — H&P (Signed)
Outpatient short stay form Pre-procedure 01/08/2023  Craig Bill, MD  Primary Physician: Toya Smothers, PA  Reason for visit:  GERD/Screening colonoscopy  History of present illness:    57 y/o gentleman with history of GERD and chronic dysphagia here for EGD for GERD and screening colonoscopy. No blood thinners. No family history of GI malignancies. No abdominal surgeries.    Current Facility-Administered Medications:    0.9 %  sodium chloride infusion, , Intravenous, Continuous, Dalayza Zambrana, Rossie Muskrat, MD, Last Rate: 20 mL/hr at 01/08/23 0726, New Bag at 01/08/23 0726  Medications Prior to Admission  Medication Sig Dispense Refill Last Dose   cyclobenzaprine (AMRIX) 15 MG 24 hr capsule Take 15 mg by mouth daily as needed for muscle spasms.   Past Month   Nutritional Supplements (ENSURE PLUS HIGH PROTEIN PO) Take by mouth.      omeprazole (PRILOSEC) 20 MG capsule Take 20 mg by mouth daily as needed.   01/07/2023   traMADol (ULTRAM) 50 MG tablet Take 50 mg by mouth every 6 (six) hours as needed.   Past Week   benzonatate (TESSALON) 200 MG capsule Take by mouth.      chlorpheniramine-HYDROcodone (TUSSIONEX) 10-8 MG/5ML SUER Take by mouth.      Multiple Vitamin (MULTIVITAMIN WITH MINERALS) TABS tablet Take 1 tablet by mouth daily.        Allergies  Allergen Reactions   Ketorolac Swelling    Face and lip swelling s/p injection   Naproxen Swelling    Swelling of tongue, lips, jaw   Nsaids Swelling    Face and tongue swelling with naprosyn and toradol     Past Medical History:  Diagnosis Date   GERD (gastroesophageal reflux disease)    Tinnitus    on 10% disability with [employer] (was in [employer]) for tinnitus    Review of systems:  Otherwise negative.    Physical Exam  Gen: Alert, oriented. Appears stated age.  HEENT:  PERRLA. Lungs: No respiratory distress CV: RRR Abd: soft, benign, no masses Ext: No edema    Planned procedures: Proceed with EGD/colonoscopy. The  patient understands the nature of the planned procedure, indications, risks, alternatives and potential complications including but not limited to bleeding, infection, perforation, damage to internal organs and possible oversedation/side effects from anesthesia. The patient agrees and gives consent to proceed.  Please refer to procedure notes for findings, recommendations and patient disposition/instructions.     Craig Bill, MD [facility] Gastroenterology

## 2023-01-08 NOTE — Op Note (Signed)
[facility] Gastroenterology Patient Name: Craig Bowen Procedure Date: 01/08/2023 7:06 AM MRN: [id number] Account #: [id number] Date of Birth: Jun 24, 1965 Admit Type: Outpatient Age: 57 Room: [facility] ENDO ROOM 1 Gender: Male Note Status: Finalized Instrument Name: Prentice Docker [id number] Procedure:             Colonoscopy Indications:           Screening for colorectal malignant neoplasm Providers:             Eather Colas MD, MD Referring MD:          Toya Smothers (Referring MD) Medicines:             Monitored Anesthesia Care Complications:         No immediate complications. Procedure:             Pre-Anesthesia Assessment:                        - Prior to the procedure, a History and Physical was                         performed, and patient medications and allergies were                         reviewed. The patient is competent. The risks and                         benefits of the procedure and the sedation options and                         risks were discussed with the patient. All questions                         were answered and informed consent was obtained.                         Patient identification and proposed procedure were                         verified by the physician, the nurse, the                         anesthesiologist, the anesthetist and the technician                         in the endoscopy suite. Mental Status Examination:                         alert and oriented. Airway Examination: normal                         oropharyngeal airway and neck mobility. Respiratory                         Examination: clear to auscultation. CV Examination:                         normal. Prophylactic Antibiotics: The patient does not  require prophylactic antibiotics. Prior                         Anticoagulants: The patient has taken no anticoagulant                         or antiplatelet agents. ASA Grade Assessment: II  - A                         patient with mild systemic disease. After reviewing                         the risks and benefits, the patient was deemed in                         satisfactory condition to undergo the procedure. The                         anesthesia plan was to use monitored anesthesia care                         (MAC). Immediately prior to administration of                         medications, the patient was re-assessed for adequacy                         to receive sedatives. The heart rate, respiratory                         rate, oxygen saturations, blood pressure, adequacy of                         pulmonary ventilation, and response to care were                         monitored throughout the procedure. The physical                         status of the patient was re-assessed after the                         procedure.                        After obtaining informed consent, the colonoscope was                         passed under direct vision. Throughout the procedure,                         the patient's blood pressure, pulse, and oxygen                         saturations were monitored continuously. The                         Colonoscope was introduced through the anus and  advanced to the the cecum, identified by appendiceal                         orifice and ileocecal valve. The colonoscopy was                         technically difficult and complex due to a tortuous                         colon. Successful completion of the procedure was                         aided by changing the patient to a supine position and                         applying abdominal pressure. The patient tolerated the                         procedure well. The quality of the bowel preparation                         was good. The ileocecal valve, appendiceal orifice,                         and rectum were photographed. Findings:      The perianal  and digital rectal examinations were normal.      Internal hemorrhoids were found during retroflexion. The hemorrhoids       were Grade II (internal hemorrhoids that prolapse but reduce       spontaneously).      The exam was otherwise without abnormality on direct and retroflexion       views. Impression:            - Internal hemorrhoids.                        - The examination was otherwise normal on direct and                         retroflexion views.                        - No specimens collected. Recommendation:        - Discharge patient to home.                        - Resume previous diet.                        - Continue present medications.                        - Repeat colonoscopy in 5 years for screening purposes                         due to tortuous colon.                        - Return to referring physician as previously  scheduled. Procedure Code(s):     --- Professional ---                        Z6109, Colorectal cancer screening; colonoscopy on                         individual not meeting criteria for high risk Diagnosis Code(s):     --- Professional ---                        Z12.11, Encounter for screening for malignant neoplasm                         of colon                        K64.1, Second degree hemorrhoids CPT copyright 2022 American Medical Association. All rights reserved. The codes documented in this report are preliminary and upon coder review may  be revised to meet current compliance requirements. Eather Colas MD, MD 01/08/2023 8:34:55 AM Number of Addenda: 0 Note Initiated On: 01/08/2023 7:06 AM Scope Withdrawal Time: 0 hours 6 minutes 59 seconds  Total Procedure Duration: 0 hours 29 minutes 56 seconds  Estimated Blood Loss:  Estimated blood loss: none.      [facility]

## 2023-01-08 NOTE — Anesthesia Preprocedure Evaluation (Signed)
Anesthesia Evaluation  Patient identified by MRN, date of birth, ID band Patient awake    Reviewed: Allergy & Precautions, NPO status , Patient's Chart, lab work & pertinent test results  History of Anesthesia Complications Negative for: history of anesthetic complications  Airway Mallampati: II  TM Distance: >3 FB Neck ROM: full    Dental no notable dental hx.    Pulmonary neg pulmonary ROS   Pulmonary exam normal        Cardiovascular negative cardio ROS Normal cardiovascular exam     Neuro/Psych negative neurological ROS  negative psych ROS   GI/Hepatic Neg liver ROS,GERD  Controlled and Medicated,,  Endo/Other  negative endocrine ROS    Renal/GU negative Renal ROS  negative genitourinary   Musculoskeletal   Abdominal Normal abdominal exam  (+)   Peds  Hematology negative hematology ROS (+)   Anesthesia Other Findings Past Medical History: No date: GERD (gastroesophageal reflux disease) No date: Tinnitus     Comment:  on 10% disability with [employer] (was in [employer]) for               tinnitus  Past Surgical History: 12/2014: BICEPS TENODESIS; Left     Comment:  and 09/2016 No date: NECK SURGERY august 2016: SHOULDER SURGERY; Left No date: SPINE SURGERY No date: VASECTOMY     Reproductive/Obstetrics negative OB ROS                             Anesthesia Physical Anesthesia Plan  ASA: 2  Anesthesia Plan: General   Post-op Pain Management: Minimal or no pain anticipated   Induction: Intravenous  PONV Risk Score and Plan: 1 and Propofol infusion and TIVA  Airway Management Planned: Natural Airway and Nasal Cannula  Additional Equipment:   Intra-op Plan:   Post-operative Plan:   Informed Consent: I have reviewed the patients History and Physical, chart, labs and discussed the procedure including the risks, benefits and alternatives for the proposed anesthesia with  the patient or authorized representative who has indicated his/her understanding and acceptance.     Dental Advisory Given  Plan Discussed with: Anesthesiologist, CRNA and Surgeon  Anesthesia Plan Comments: (Patient consented for risks of anesthesia including but not limited to:  - adverse reactions to medications - risk of airway placement if required - damage to eyes, teeth, lips or other oral mucosa - nerve damage due to positioning  - sore throat or hoarseness - Damage to heart, brain, nerves, lungs, other parts of body or loss of life  Patient voiced understanding.)        Anesthesia Quick Evaluation

## 2023-01-08 NOTE — Anesthesia Postprocedure Evaluation (Signed)
Anesthesia Post Note  Patient: [name]  Procedure(s) Performed: COLONOSCOPY WITH PROPOFOL ESOPHAGOGASTRODUODENOSCOPY (EGD) WITH PROPOFOL  Patient location during evaluation: Endoscopy Anesthesia Type: General Level of consciousness: awake and alert Pain management: pain level controlled Vital Signs Assessment: post-procedure vital signs reviewed and stable Respiratory status: spontaneous breathing, nonlabored ventilation, respiratory function stable and patient connected to nasal cannula oxygen Cardiovascular status: blood pressure returned to baseline and stable Postop Assessment: no apparent nausea or vomiting Anesthetic complications: no   No notable events documented.   Last Vitals:  Vitals:   01/08/23 0829 01/08/23 0834  BP: (!) 83/37 93/66  Pulse: 70   Resp: 16   Temp: (!) 36.3 C   SpO2: 100%     Last Pain:  Vitals:   01/08/23 0839  TempSrc:   PainSc: 0-No pain                 Craig Bowen

## 2023-01-08 NOTE — Op Note (Signed)
[facility] Gastroenterology Patient Name: Craig Bowen Procedure Date: 01/08/2023 7:07 AM MRN: [id number] Account #: [id number] Date of Birth: 15-Jun-1965 Admit Type: Outpatient Age: 57 Room: [facility] ENDO ROOM 1 Gender: Male Note Status: Finalized Instrument Name: Upper Endoscope [id number] Procedure:             Upper GI endoscopy Indications:           Gastro-esophageal reflux disease Providers:             Eather Colas MD, MD Referring MD:          Toya Smothers (Referring MD) Medicines:             Monitored Anesthesia Care Complications:         No immediate complications. Estimated blood loss:                         Minimal. Procedure:             Pre-Anesthesia Assessment:                        - Prior to the procedure, a History and Physical was                         performed, and patient medications and allergies were                         reviewed. The patient is competent. The risks and                         benefits of the procedure and the sedation options and                         risks were discussed with the patient. All questions                         were answered and informed consent was obtained.                         Patient identification and proposed procedure were                         verified by the physician, the nurse, the                         anesthesiologist, the anesthetist and the technician                         in the endoscopy suite. Mental Status Examination:                         alert and oriented. Airway Examination: normal                         oropharyngeal airway and neck mobility. Respiratory                         Examination: clear to auscultation. CV Examination:  normal. Prophylactic Antibiotics: The patient does not                         require prophylactic antibiotics. Prior                         Anticoagulants: The patient has taken no anticoagulant                          or antiplatelet agents. ASA Grade Assessment: II - A                         patient with mild systemic disease. After reviewing                         the risks and benefits, the patient was deemed in                         satisfactory condition to undergo the procedure. The                         anesthesia plan was to use monitored anesthesia care                         (MAC). Immediately prior to administration of                         medications, the patient was re-assessed for adequacy                         to receive sedatives. The heart rate, respiratory                         rate, oxygen saturations, blood pressure, adequacy of                         pulmonary ventilation, and response to care were                         monitored throughout the procedure. The physical                         status of the patient was re-assessed after the                         procedure.                        After obtaining informed consent, the endoscope was                         passed under direct vision. Throughout the procedure,                         the patient's blood pressure, pulse, and oxygen                         saturations were monitored continuously. The Endoscope  was introduced through the mouth, and advanced to the                         second part of duodenum. The upper GI endoscopy was                         accomplished without difficulty. The patient tolerated                         the procedure well. Findings:      The examined esophagus was normal. Biopsies were obtained from the       proximal and distal esophagus with cold forceps for histology of       suspected eosinophilic esophagitis. Estimated blood loss was minimal.      The entire examined stomach was normal.      The examined duodenum was normal. Impression:            - Normal esophagus.                        - Normal stomach.                        -  Normal examined duodenum.                        - Biopsies were taken with a cold forceps for                         evaluation of eosinophilic esophagitis. Recommendation:        - Discharge patient to home.                        - Resume previous diet.                        - Continue present medications.                        - Await pathology results.                        - Return to referring physician as previously                         scheduled. Procedure Code(s):     --- Professional ---                        [phone], Esophagogastroduodenoscopy, flexible,                         transoral; with biopsy, single or multiple Diagnosis Code(s):     --- Professional ---                        K21.9, Gastro-esophageal reflux disease without                         esophagitis CPT copyright 2022 [employer]. All rights reserved. The codes documented in this report are preliminary and upon coder review may  be revised to meet current compliance  requirements. Eather Colas MD, MD 01/08/2023 8:32:11 AM Number of Addenda: 0 Note Initiated On: 01/08/2023 7:07 AM Estimated Blood Loss:  Estimated blood loss was minimal.      [facility]

## 2023-01-08 NOTE — Interval H&P Note (Signed)
History and Physical Interval Note:  01/08/2023 7:40 AM  Craig Bowen  has presented today for surgery, with the diagnosis of GERD Screening Colon.  The various methods of treatment have been discussed with the patient and family. After consideration of risks, benefits and other options for treatment, the patient has consented to  Procedure(s): COLONOSCOPY WITH PROPOFOL (N/A) ESOPHAGOGASTRODUODENOSCOPY (EGD) WITH PROPOFOL (N/A) as a surgical intervention.  The patient's history has been reviewed, patient examined, no change in status, stable for surgery.  I have reviewed the patient's chart and labs.  Questions were answered to the patient's satisfaction.     Regis Bill  Ok to proceed with EGD/Colonoscopy

## 2023-01-09 ENCOUNTER — Encounter: Payer: Self-pay | Admitting: Gastroenterology
